# Patient Record
Sex: Female | Born: 1955 | Race: White | Hispanic: No
Health system: Southern US, Community
[De-identification: ages and names within clinical notes are randomized; demographics above are authoritative.]

## PROBLEM LIST (undated history)

## (undated) HISTORY — PX: BREAST CYST ASPIRATION: SHX578

## (undated) HISTORY — PX: ABDOMINAL HYSTERECTOMY: SHX81

---

## 2004-01-24 ENCOUNTER — Ambulatory Visit: Payer: Self-pay | Admitting: Podiatry

## 2004-07-15 ENCOUNTER — Ambulatory Visit: Payer: Self-pay | Admitting: Obstetrics and Gynecology

## 2004-11-05 ENCOUNTER — Ambulatory Visit: Payer: Self-pay | Admitting: Obstetrics and Gynecology

## 2006-02-25 ENCOUNTER — Ambulatory Visit: Payer: Self-pay | Admitting: Unknown Physician Specialty

## 2007-02-07 ENCOUNTER — Ambulatory Visit: Payer: Self-pay | Admitting: Specialist

## 2007-06-29 ENCOUNTER — Ambulatory Visit: Payer: Self-pay | Admitting: Unknown Physician Specialty

## 2007-07-04 ENCOUNTER — Ambulatory Visit: Payer: Self-pay | Admitting: Unknown Physician Specialty

## 2008-09-12 ENCOUNTER — Ambulatory Visit: Payer: Self-pay | Admitting: Unknown Physician Specialty

## 2009-12-29 ENCOUNTER — Ambulatory Visit: Payer: Self-pay | Admitting: Unknown Physician Specialty

## 2011-05-12 ENCOUNTER — Ambulatory Visit: Payer: Self-pay | Admitting: Unknown Physician Specialty

## 2012-08-03 ENCOUNTER — Ambulatory Visit: Payer: Self-pay | Admitting: Unknown Physician Specialty

## 2013-12-26 ENCOUNTER — Ambulatory Visit: Payer: Self-pay | Admitting: Internal Medicine

## 2015-02-26 DIAGNOSIS — C4492 Squamous cell carcinoma of skin, unspecified: Secondary | ICD-10-CM

## 2015-02-26 HISTORY — DX: Squamous cell carcinoma of skin, unspecified: C44.92

## 2015-04-16 ENCOUNTER — Other Ambulatory Visit: Payer: Self-pay | Admitting: Internal Medicine

## 2015-04-16 DIAGNOSIS — Z1239 Encounter for other screening for malignant neoplasm of breast: Secondary | ICD-10-CM

## 2015-04-24 ENCOUNTER — Ambulatory Visit
Admission: RE | Admit: 2015-04-24 | Discharge: 2015-04-24 | Disposition: A | Discharge status: Home / Self Care | Payer: BC Managed Care – PPO | Source: Ambulatory Visit | Attending: Internal Medicine | Admitting: Internal Medicine

## 2015-04-24 DIAGNOSIS — Z1239 Encounter for other screening for malignant neoplasm of breast: Secondary | ICD-10-CM

## 2015-04-24 DIAGNOSIS — Z1231 Encounter for screening mammogram for malignant neoplasm of breast: Secondary | ICD-10-CM | POA: Diagnosis present

## 2015-04-28 ENCOUNTER — Ambulatory Visit: Payer: Self-pay

## 2016-08-16 ENCOUNTER — Other Ambulatory Visit: Payer: Self-pay | Admitting: Internal Medicine

## 2016-08-16 DIAGNOSIS — Z1231 Encounter for screening mammogram for malignant neoplasm of breast: Secondary | ICD-10-CM

## 2016-09-02 ENCOUNTER — Ambulatory Visit
Admission: RE | Admit: 2016-09-02 | Discharge: 2016-09-02 | Disposition: A | Discharge status: Home / Self Care | Payer: BC Managed Care – PPO | Source: Ambulatory Visit | Attending: Internal Medicine | Admitting: Internal Medicine

## 2016-09-02 DIAGNOSIS — Z1231 Encounter for screening mammogram for malignant neoplasm of breast: Secondary | ICD-10-CM | POA: Insufficient documentation

## 2018-02-22 ENCOUNTER — Other Ambulatory Visit: Payer: Self-pay | Admitting: Internal Medicine

## 2018-02-22 DIAGNOSIS — Z1231 Encounter for screening mammogram for malignant neoplasm of breast: Secondary | ICD-10-CM

## 2018-03-20 ENCOUNTER — Ambulatory Visit
Admission: RE | Admit: 2018-03-20 | Discharge: 2018-03-20 | Disposition: A | Discharge status: Home / Self Care | Payer: BC Managed Care – PPO | Source: Ambulatory Visit | Attending: Internal Medicine | Admitting: Internal Medicine

## 2018-03-20 DIAGNOSIS — Z1231 Encounter for screening mammogram for malignant neoplasm of breast: Secondary | ICD-10-CM | POA: Diagnosis present

## 2019-05-24 ENCOUNTER — Other Ambulatory Visit: Payer: Self-pay | Admitting: Internal Medicine

## 2019-05-24 DIAGNOSIS — Z1231 Encounter for screening mammogram for malignant neoplasm of breast: Secondary | ICD-10-CM

## 2019-06-12 ENCOUNTER — Ambulatory Visit
Admission: RE | Admit: 2019-06-12 | Discharge: 2019-06-12 | Disposition: A | Discharge status: Home / Self Care | Payer: BC Managed Care – PPO | Source: Ambulatory Visit | Attending: Internal Medicine | Admitting: Internal Medicine

## 2019-06-12 DIAGNOSIS — Z1231 Encounter for screening mammogram for malignant neoplasm of breast: Secondary | ICD-10-CM | POA: Diagnosis present

## 2019-11-14 ENCOUNTER — Other Ambulatory Visit: Payer: Self-pay

## 2019-11-14 ENCOUNTER — Ambulatory Visit
Admission: RE | Admit: 2019-11-14 | Discharge: 2019-11-14 | Disposition: A | Discharge status: Home / Self Care | Payer: BC Managed Care – PPO | Source: Ambulatory Visit | Attending: General Practice | Admitting: General Practice

## 2019-11-14 ENCOUNTER — Other Ambulatory Visit: Payer: Self-pay | Admitting: General Practice

## 2019-11-14 ENCOUNTER — Ambulatory Visit
Admission: RE | Admit: 2019-11-14 | Discharge: 2019-11-14 | Disposition: A | Discharge status: Home / Self Care | Payer: BC Managed Care – PPO | Attending: General Practice | Admitting: General Practice

## 2019-11-14 DIAGNOSIS — R52 Pain, unspecified: Secondary | ICD-10-CM | POA: Diagnosis present

## 2020-04-15 ENCOUNTER — Other Ambulatory Visit: Payer: Self-pay

## 2020-04-15 ENCOUNTER — Ambulatory Visit: Payer: BC Managed Care – PPO | Admitting: Dermatology

## 2020-04-15 DIAGNOSIS — L309 Dermatitis, unspecified: Secondary | ICD-10-CM | POA: Diagnosis not present

## 2020-04-15 DIAGNOSIS — L509 Urticaria, unspecified: Secondary | ICD-10-CM | POA: Diagnosis not present

## 2020-04-15 MED ORDER — HYDROXYZINE HCL 10 MG PO TABS: ORAL_TABLET | ORAL | 1 refills | Status: AC

## 2020-04-15 MED ORDER — CLOBETASOL PROPIONATE 0.05 % EX SOLN: 1.0000 "application " | Freq: Two times a day (BID) | CUTANEOUS | 0 refills | Status: AC

## 2020-04-15 MED ORDER — LEVOCETIRIZINE DIHYDROCHLORIDE 5 MG PO TABS: 5.0000 mg | ORAL_TABLET | Freq: Every morning | ORAL | 3 refills | Status: AC

## 2020-04-15 NOTE — Patient Instructions (Addendum)
Mix 50 ml of Clobetasol solution (prescription) into a 1 pound jar of Cerave cream. Mark jar as mixed with steroid. Apply to affected areas twice daily. Avoid face, groin and underarms.  Recommend changing from Claritin to Xyzal 5 mg 1 po qd  Start Hydroxyzine 1-3 po qhs (may make drowsy)  May add back Claritin mid day if needed for break through itching.

## 2020-04-15 NOTE — Progress Notes (Signed)
   Follow-Up Visit   Subjective  Destiny Holland is a 65 y.o. female who presents for the following: Rash (Hives that are spreading and are very itchy. Started last night and she took Benadryl which helped the itch but she woke up with a headache. She takes Claritin daily. Has used [employer] and Cetaphil lotion for years. No new soaps or detergents. No new medications. She does have increased stress in taking care of her 40 year oil mother and stress at work.). Skin tends to get dry in winter.  No one else itching at home. No prior h/o eczema.    The following portions of the chart were reviewed this encounter and updated as appropriate:       Review of Systems:  No other skin or systemic complaints except as noted in HPI or Assessment and Plan.  Objective  Well appearing patient in no apparent distress; mood and affect are within normal limits.  A focused examination was performed including trunk, arms. Relevant physical exam findings are noted in the Assessment and Plan.  Objective  Left Elbow - Posterior: Smooth edematous pink plaques of elbows, lower back  Objective  Mid Back: Numerous pink scaly papules and patches of trunk and arms   Assessment & Plan  Urticaria Left Elbow - Posterior  Recommend changing from Claritin to Xyzal 5 mg 1 po qd  Start Hydroxyzine 10 mg 1-3 po qhs (may make drowsy)  May add back Claritin mid day if needed for breakthrough itching.  hydrOXYzine (ATARAX/VISTARIL) 10 MG tablet - Left Elbow - Posterior  levocetirizine (XYZAL) 5 MG tablet - Left Elbow - Posterior  Dermatitis Mid Back  Nummular/asteatotic  Mix 50 ml of Clobetasol solution (prescription) into a 1 pound jar of Cerave cream. Mark jar as mixed with steroid. Apply to affected areas twice daily up to 4 weeks. Avoid face, groin and underarms.  Continue mild soap  Consider bx and/or prednisone if not improving on f/up  clobetasol (TEMOVATE) 0.05 % external solution - Mid  Back  Return for Follow up as scheduled w/Dr. Raliegh Ip in one week.  I, Ashok Cordia, CMA, am acting as scribe for Brendolyn Patty, MD .  Documentation: I have reviewed the above documentation for accuracy and completeness, and I agree with the above.  Brendolyn Patty MD

## 2020-04-21 ENCOUNTER — Ambulatory Visit: Payer: BC Managed Care – PPO | Admitting: Dermatology

## 2020-04-21 ENCOUNTER — Other Ambulatory Visit: Payer: Self-pay | Admitting: Dermatology

## 2020-04-21 ENCOUNTER — Other Ambulatory Visit: Payer: Self-pay

## 2020-04-21 DIAGNOSIS — Z1283 Encounter for screening for malignant neoplasm of skin: Secondary | ICD-10-CM

## 2020-04-21 DIAGNOSIS — L814 Other melanin hyperpigmentation: Secondary | ICD-10-CM

## 2020-04-21 DIAGNOSIS — L309 Dermatitis, unspecified: Secondary | ICD-10-CM | POA: Diagnosis not present

## 2020-04-21 DIAGNOSIS — D485 Neoplasm of uncertain behavior of skin: Secondary | ICD-10-CM

## 2020-04-21 DIAGNOSIS — L820 Inflamed seborrheic keratosis: Secondary | ICD-10-CM

## 2020-04-21 DIAGNOSIS — D1800 Hemangioma unspecified site: Secondary | ICD-10-CM

## 2020-04-21 DIAGNOSIS — L578 Other skin changes due to chronic exposure to nonionizing radiation: Secondary | ICD-10-CM | POA: Diagnosis not present

## 2020-04-21 DIAGNOSIS — Z85828 Personal history of other malignant neoplasm of skin: Secondary | ICD-10-CM | POA: Diagnosis not present

## 2020-04-21 DIAGNOSIS — L821 Other seborrheic keratosis: Secondary | ICD-10-CM

## 2020-04-21 DIAGNOSIS — D229 Melanocytic nevi, unspecified: Secondary | ICD-10-CM

## 2020-04-21 NOTE — Patient Instructions (Signed)

## 2020-04-21 NOTE — Progress Notes (Unsigned)
Follow-Up Visit   Subjective  Destiny Holland is a 65 y.o. female who presents for the following: Annual Exam. The patient presents for Total-Body Skin Exam (TBSE) for skin cancer screening and mole check.  The following portions of the chart were reviewed this encounter and updated as appropriate:   Allergies  Meds  Problems  Med Hx  Surg Hx  Fam Hx     Review of Systems:  No other skin or systemic complaints except as noted in HPI or Assessment and Plan.  Objective  Well appearing patient in no apparent distress; mood and affect are within normal limits.  A full examination was performed including scalp, head, eyes, ears, nose, lips, neck, chest, axillae, abdomen, back, buttocks, bilateral upper extremities, bilateral lower extremities, hands, feet, fingers, toes, fingernails, and toenails. All findings within normal limits unless otherwise noted below.  Objective  Right Elbow - Posterior: 0.6 cm hyperkeratotic papule  Objective  Right chest: Red patch with dilated blood vessels       Objective  Left mandible x 1, right thigh x 1 (2): Erythematous keratotic or waxy stuck-on papule or plaque.   Objective  Back: Erythema and crusts   Assessment & Plan    History of Squamous Cell Carcinoma of the Skin - No evidence of recurrence today - No lymphadenopathy - Recommend regular full body skin exams - Recommend daily broad spectrum sunscreen SPF 30+ to sun-exposed areas, reapply every 2 hours as needed.  - Call if any new or changing lesions are noted between office visits   Lentigines - Scattered tan macules - Discussed due to sun exposure - Benign, observe - Call for any changes  Seborrheic Keratoses - Stuck-on, waxy, tan-brown papules and plaques  - Discussed benign etiology and prognosis. - Observe - Call for any changes  Melanocytic Nevi - Tan-brown and/or pink-flesh-colored symmetric macules and papules - Benign appearing on exam today -  Observation - Call clinic for new or changing moles - Recommend daily use of broad spectrum spf 30+ sunscreen to sun-exposed areas.   Hemangiomas - Red papules - Discussed benign nature - Observe - Call for any changes  Actinic Damage - Chronic, secondary to cumulative UV/sun exposure - diffuse scaly erythematous macules with underlying dyspigmentation - Recommend daily broad spectrum sunscreen SPF 30+ to sun-exposed areas, reapply every 2 hours as needed.  - Call for new or changing lesions.  Skin cancer screening performed today.   Neoplasm of uncertain behavior of skin (2) Right Elbow - Posterior  Epidermal / dermal shaving  Lesion diameter (cm):  0.6 Informed consent: discussed and consent obtained   Timeout: patient name, date of birth, surgical site, and procedure verified   Procedure prep:  Patient was prepped and draped in usual sterile fashion Prep type:  Isopropyl alcohol Anesthesia: the lesion was anesthetized in a standard fashion   Anesthetic:  1% lidocaine w/ epinephrine 1-100,000 buffered w/ 8.4% NaHCO3 Instrument used: flexible razor blade   Hemostasis achieved with: pressure, aluminum chloride and electrodesiccation   Outcome: patient tolerated procedure well   Post-procedure details: sterile dressing applied and wound care instructions given   Dressing type: bandage and petrolatum    Specimen 1 - Surgical pathology Differential Diagnosis: Inflamed SK vs Irritated nevus R/O Ca Check Margins: No 0.6 cm hyperkeratotic papule  Right chest  Skin / nail biopsy Type of biopsy: tangential   Informed consent: discussed and consent obtained   Timeout: patient name, date of birth, surgical site, and procedure  verified   Procedure prep:  Patient was prepped and draped in usual sterile fashion Prep type:  Isopropyl alcohol Anesthesia: the lesion was anesthetized in a standard fashion   Anesthetic:  1% lidocaine w/ epinephrine 1-100,000 buffered w/ 8.4%  NaHCO3 Instrument used: flexible razor blade   Hemostasis achieved with: pressure, aluminum chloride and electrodesiccation   Outcome: patient tolerated procedure well   Post-procedure details: sterile dressing applied and wound care instructions given   Dressing type: bandage and petrolatum    Specimen 2 - Surgical pathology Differential Diagnosis: R/O BCC Check Margins: No Red patch with dilated blood vessels  Inflamed seborrheic keratosis (2) Left mandible x 1, right thigh x 1  Destruction of lesion - Left mandible x 1, right thigh x 1 Complexity: simple   Destruction method: cryotherapy   Informed consent: discussed and consent obtained   Timeout:  patient name, date of birth, surgical site, and procedure verified Lesion destroyed using liquid nitrogen: Yes   Region frozen until ice ball extended beyond lesion: Yes   Outcome: patient tolerated procedure well with no complications   Post-procedure details: wound care instructions given    Dermatitis Back Nummular Dermatitis/Asteototic eczema/ Atopic Dermatitis - Improving  Atopic dermatitis (eczema) is a chronic, relapsing, pruritic condition that can significantly affect quality of life. It is often associated with allergic rhinitis and/or asthma and can require treatment with topical medications, phototherapy, or in severe cases a biologic medication called Dupixent in older children and adults.   Continue Clobetasol/Cerave cream mix to affected areas until clear  Other Related Medications clobetasol (TEMOVATE) 0.05 % external solution  Skin cancer screening  Return in about 1 year (around 04/21/2021) for TBSE.  I, Ashok Cordia, CMA, am acting as scribe for Sarina Ser, MD .  Documentation: I have reviewed the above documentation for accuracy and completeness, and I agree with the above.  Sarina Ser, MD

## 2020-04-24 ENCOUNTER — Encounter: Payer: Self-pay | Admitting: Dermatology

## 2020-04-29 ENCOUNTER — Telehealth: Payer: Self-pay

## 2020-04-29 NOTE — Telephone Encounter (Signed)
Patient informed of pathology results and appointment scheduled for treatment.

## 2020-04-29 NOTE — Telephone Encounter (Signed)
-----   Message from Ralene Bathe, MD sent at 04/23/2020 12:49 PM EST ----- Diagnosis 1. Skin , right elbow-posterior VERRUCA VULGARIS, IRRITATED 2. Skin , right chest LICHEN PLANUS-LIKE KERATOSIS WITH FEATURES OF ACTINIC KERATOSIS  1- benign viral wart May recur Will treat if recurs 2- PreCancer Schedule in 2 mos for re-check and plan to treat (with LN2)

## 2020-06-24 ENCOUNTER — Other Ambulatory Visit: Payer: Self-pay

## 2020-06-24 ENCOUNTER — Ambulatory Visit: Payer: BC Managed Care – PPO | Admitting: Dermatology

## 2020-06-24 ENCOUNTER — Encounter: Payer: Self-pay | Admitting: Dermatology

## 2020-06-24 DIAGNOSIS — L910 Hypertrophic scar: Secondary | ICD-10-CM

## 2020-06-24 DIAGNOSIS — L570 Actinic keratosis: Secondary | ICD-10-CM | POA: Diagnosis not present

## 2020-06-24 DIAGNOSIS — L578 Other skin changes due to chronic exposure to nonionizing radiation: Secondary | ICD-10-CM | POA: Diagnosis not present

## 2020-06-24 DIAGNOSIS — B078 Other viral warts: Secondary | ICD-10-CM

## 2020-06-24 NOTE — Patient Instructions (Addendum)

## 2020-06-24 NOTE — Progress Notes (Signed)
   Follow-Up Visit   Subjective  Destiny Holland is a 65 y.o. female who presents for the following: Follow-up (Patient here today for a 2 month follow up to treat bx proven AK at right chest. ).  Patient also had a bx at right elbow that showed verruca.   The following portions of the chart were reviewed this encounter and updated as appropriate:   Allergies  Meds  Problems  Med Hx  Surg Hx  Fam Hx      Review of Systems:  No other skin or systemic complaints except as noted in HPI or Assessment and Plan.  Objective  Well appearing patient in no apparent distress; mood and affect are within normal limits.  A focused examination was performed including chest, right elbow. Relevant physical exam findings are noted in the Assessment and Plan.  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 Start Serica gel in meantime.  Other viral warts Right Elbow Bx proven wart Clear today. May recur - observe.   Actinic Damage - chronic, secondary to cumulative UV radiation exposure/sun exposure over time - diffuse scaly erythematous macules with underlying dyspigmentation - Recommend daily broad spectrum sunscreen SPF 30+ to sun-exposed areas, reapply every 2 hours as needed.  -  Recommend staying in the shade or wearing long sleeves, sun glasses (UVA+UVB protection) and wide brim hats (4-inch brim around the entire circumference of the hat). - Call for new or changing lesions.  Return in about 3 months (around 09/23/2020) for recheck AK treated with LN2 at right chest.  Graciella Belton, RMA, am acting as scribe for Sarina Ser, MD . Documentation: I have reviewed the above documentation for accuracy and completeness, and I agree with the above.  Sarina Ser, MD

## 2020-07-01 ENCOUNTER — Other Ambulatory Visit: Payer: Self-pay | Admitting: Internal Medicine

## 2020-07-01 DIAGNOSIS — Z1231 Encounter for screening mammogram for malignant neoplasm of breast: Secondary | ICD-10-CM

## 2020-07-15 ENCOUNTER — Ambulatory Visit
Admission: RE | Admit: 2020-07-15 | Discharge: 2020-07-15 | Disposition: A | Discharge status: Home / Self Care | Payer: BC Managed Care – PPO | Source: Ambulatory Visit | Attending: Internal Medicine | Admitting: Internal Medicine

## 2020-07-15 ENCOUNTER — Other Ambulatory Visit: Payer: Self-pay

## 2020-07-15 DIAGNOSIS — Z1231 Encounter for screening mammogram for malignant neoplasm of breast: Secondary | ICD-10-CM | POA: Diagnosis present

## 2020-10-06 ENCOUNTER — Other Ambulatory Visit: Payer: Self-pay

## 2020-10-06 ENCOUNTER — Ambulatory Visit: Payer: BC Managed Care – PPO | Admitting: Dermatology

## 2020-10-06 DIAGNOSIS — L820 Inflamed seborrheic keratosis: Secondary | ICD-10-CM

## 2020-10-06 DIAGNOSIS — Z872 Personal history of diseases of the skin and subcutaneous tissue: Secondary | ICD-10-CM

## 2020-10-06 DIAGNOSIS — L578 Other skin changes due to chronic exposure to nonionizing radiation: Secondary | ICD-10-CM | POA: Diagnosis not present

## 2020-10-06 DIAGNOSIS — L821 Other seborrheic keratosis: Secondary | ICD-10-CM

## 2020-10-06 DIAGNOSIS — D485 Neoplasm of uncertain behavior of skin: Secondary | ICD-10-CM | POA: Diagnosis not present

## 2020-10-06 DIAGNOSIS — D492 Neoplasm of unspecified behavior of bone, soft tissue, and skin: Secondary | ICD-10-CM

## 2020-10-06 NOTE — Progress Notes (Signed)
   Follow-Up Visit   Subjective  Destiny Holland is a 65 y.o. female who presents for the following: Actinic Keratosis (3 months f/u hx of Aks at the right chest treated with LN2). Pt c/o several irritated spots on the left arm growth pt squeezed and left lower leg pink irritated area pt would like checked today.   The following portions of the chart were reviewed this encounter and updated as appropriate:   Allergies  Meds  Problems  Med Hx  Surg Hx  Fam Hx     Review of Systems:  No other skin or systemic complaints except as noted in HPI or Assessment and Plan.  Objective  Well appearing patient in no apparent distress; mood and affect are within normal limits.  A focused examination was performed including face,chest. Relevant physical exam findings are noted in the Assessment and Plan.  left forearm near wrist 0.4 cm pink inflamed papule   Left Lower Leg - Anterior Erythematous keratotic or waxy stuck-on papule or plaque.   right chest Scar healing    Assessment & Plan  Neoplasm of skin left forearm near wrist Red papule patient squeezed and exacerbated. Suspect trauma vs bite reaction. If not gone in 4-6 weeks return for biopsy  Observe   Inflamed seborrheic keratosis Left Lower Leg - Anterior  Destruction of lesion - Left Lower Leg - Anterior Complexity: simple   Destruction method: cryotherapy   Informed consent: discussed and consent obtained   Timeout:  patient name, date of birth, surgical site, and procedure verified Lesion destroyed using liquid nitrogen: Yes   Region frozen until ice ball extended beyond lesion: Yes   Outcome: patient tolerated procedure well with no complications   Post-procedure details: wound care instructions given    Hx of actinic keratosis right chest  Clear. Observe for recurrence. Call clinic for new or changing lesions.  Recommend regular skin exams, daily broad-spectrum spf 30+ sunscreen use, and photoprotection.      Actinic Damage - chronic, secondary to cumulative UV radiation exposure/sun exposure over time - diffuse scaly erythematous macules with underlying dyspigmentation - Recommend daily broad spectrum sunscreen SPF 30+ to sun-exposed areas, reapply every 2 hours as needed.  - Recommend staying in the shade or wearing long sleeves, sun glasses (UVA+UVB protection) and wide brim hats (4-inch brim around the entire circumference of the hat). - Call for new or changing lesions.   Seborrheic Keratoses - Stuck-on, waxy, tan-brown papules and/or plaques  - Benign-appearing - Discussed benign etiology and prognosis. - Observe - Call for any changes  Return for as scheduled July 01, 2021 for TBSE .  IMarye Round, CMA, am acting as scribe for Sarina Ser, MD .  Documentation: I have reviewed the above documentation for accuracy and completeness, and I agree with the above.  Sarina Ser, MD

## 2020-10-06 NOTE — Patient Instructions (Addendum)

## 2020-10-07 ENCOUNTER — Encounter: Payer: Self-pay | Admitting: Dermatology

## 2020-12-11 ENCOUNTER — Ambulatory Visit: Payer: BC Managed Care – PPO | Admitting: Dermatology

## 2020-12-11 ENCOUNTER — Other Ambulatory Visit: Payer: Self-pay

## 2020-12-11 DIAGNOSIS — C44729 Squamous cell carcinoma of skin of left lower limb, including hip: Secondary | ICD-10-CM | POA: Diagnosis not present

## 2020-12-11 DIAGNOSIS — L578 Other skin changes due to chronic exposure to nonionizing radiation: Secondary | ICD-10-CM

## 2020-12-11 DIAGNOSIS — C4492 Squamous cell carcinoma of skin, unspecified: Secondary | ICD-10-CM

## 2020-12-11 DIAGNOSIS — D485 Neoplasm of uncertain behavior of skin: Secondary | ICD-10-CM

## 2020-12-11 HISTORY — DX: Squamous cell carcinoma of skin, unspecified: C44.92

## 2020-12-11 MED ORDER — MUPIROCIN 2 % EX OINT: TOPICAL_OINTMENT | CUTANEOUS | 0 refills | Status: DC

## 2020-12-11 NOTE — Patient Instructions (Signed)

## 2020-12-11 NOTE — Progress Notes (Deleted)
   Follow-Up Visit   Subjective  Destiny Holland is a 65 y.o. female who presents for the following: No chief complaint on file..  ***  The following portions of the chart were reviewed this encounter and updated as appropriate:      {Review of Systems:34166::"No other skin or systemic complaints."}  Objective  Well appearing patient in no apparent distress; mood and affect are within normal limits.  {BWIO:03559::"R full examination was performed including scalp, head, eyes, ears, nose, lips, neck, chest, axillae, abdomen, back, buttocks, bilateral upper extremities, bilateral lower extremities, hands, feet, fingers, toes, fingernails, and toenails. All findings within normal limits unless otherwise noted below."}   Assessment & Plan   No follow-ups on file. IRuthell Rummage, CMA, am acting as scribe for Sarina Ser, MD.

## 2020-12-12 ENCOUNTER — Encounter: Payer: Self-pay | Admitting: Dermatology

## 2020-12-12 NOTE — Progress Notes (Signed)
   Follow-Up Visit   Subjective  Destiny Holland is a 65 y.o. female who presents for the following: Follow-up (Patient here today for follow up on area on left lower leg that was treated with ln2 several months ago. Patient reports that area has not healed and seems to be getting worse. ).  The following portions of the chart were reviewed this encounter and updated as appropriate:   Allergies  Meds  Problems  Med Hx  Surg Hx  Fam Hx     Review of Systems:  No other skin or systemic complaints except as noted in HPI or Assessment and Plan.  Objective  Well appearing patient in no apparent distress; mood and affect are within normal limits.  examination was performed including scalp, head, eyes, ears, nose, lips, neck,  lower extremities,All findings within normal limits unless otherwise noted below.  L med pretibial 1.5 cm pink to violaceous very tender patch.   Assessment & Plan  Neoplasm of uncertain behavior of skin L med pretibial  Skin / nail biopsy Type of biopsy: tangential   Informed consent: discussed and consent obtained   Timeout: patient name, date of birth, surgical site, and procedure verified   Procedure prep:  Patient was prepped and draped in usual sterile fashion Prep type:  Isopropyl alcohol Anesthesia: the lesion was anesthetized in a standard fashion   Anesthetic:  1% lidocaine w/ epinephrine 1-100,000 buffered w/ 8.4% NaHCO3 Instrument used: flexible razor blade   Hemostasis achieved with: pressure, aluminum chloride and electrodesiccation   Outcome: patient tolerated procedure well   Post-procedure details: sterile dressing applied and wound care instructions given   Dressing type: bandage and petrolatum    mupirocin ointment (BACTROBAN) 2 % Apply to biopsy site QD until healed.  Specimen 1 - Surgical pathology Differential Diagnosis: D48.5 r/o skin CA vs lipodermatosclerosus  Check Margins: No  Start Mupirocin 2% ointment to aa's QD until  healed.   Actinic Damage - chronic, secondary to cumulative UV radiation exposure/sun exposure over time - diffuse scaly erythematous macules with underlying dyspigmentation - Recommend daily broad spectrum sunscreen SPF 30+ to sun-exposed areas, reapply every 2 hours as needed.  - Recommend staying in the shade or wearing long sleeves, sun glasses (UVA+UVB protection) and wide brim hats (4-inch brim around the entire circumference of the hat). - Call for new or changing lesions.  Return for appointment as scheduled.  Luther Redo, CMA, am acting as scribe for Sarina Ser, MD . Documentation: I have reviewed the above documentation for accuracy and completeness, and I agree with the above.  Sarina Ser, MD

## 2020-12-16 ENCOUNTER — Telehealth: Payer: Self-pay

## 2020-12-16 NOTE — Telephone Encounter (Signed)
Unable to leave a voicemail as no mailbox set up.

## 2020-12-16 NOTE — Telephone Encounter (Signed)
-----   Message from Ralene Bathe, MD sent at 12/15/2020  1:01 PM EDT ----- Diagnosis Skin , left med pretibial WELL DIFFERENTIATED SQUAMOUS CELL CARCINOMA WITH SUPERFICIAL INFILTRATION, ULCERATED  Cancer - SCC Schedule for surgery

## 2020-12-16 NOTE — Telephone Encounter (Signed)
Patient advised of BX results and scheduled for surgery.

## 2020-12-24 ENCOUNTER — Other Ambulatory Visit: Payer: Self-pay

## 2020-12-24 ENCOUNTER — Encounter: Payer: Self-pay | Admitting: Dermatology

## 2020-12-24 ENCOUNTER — Ambulatory Visit: Payer: BC Managed Care – PPO | Admitting: Dermatology

## 2020-12-24 DIAGNOSIS — C4492 Squamous cell carcinoma of skin, unspecified: Secondary | ICD-10-CM

## 2020-12-24 DIAGNOSIS — C44729 Squamous cell carcinoma of skin of left lower limb, including hip: Secondary | ICD-10-CM

## 2020-12-24 MED ORDER — DOXYCYCLINE HYCLATE 100 MG PO TABS: ORAL_TABLET | ORAL | 0 refills | Status: DC

## 2020-12-24 NOTE — Progress Notes (Signed)
   Follow-Up Visit   Subjective  Destiny Holland is a 65 y.o. female who presents for the following: recheck bx site (Of the L med pretibial - bx proven SCC, patient is concerned that site may be infected and would like it checked today.).  The following portions of the chart were reviewed this encounter and updated as appropriate:   Allergies  Meds  Problems  Med Hx  Surg Hx  Fam Hx     Review of Systems:  No other skin or systemic complaints except as noted in HPI or Assessment and Plan.  Objective  Well appearing patient in no apparent distress; mood and affect are within normal limits.  A focused examination was performed including L lower leg. Relevant physical exam findings are noted in the Assessment and Plan.  L med pretibial Healing ulceration.   Assessment & Plan  Squamous cell carcinoma of skin -biopsy-proven.  Inflamed biopsy site improved today.  No evidence of infection, but recent pain and redness at site now improved. L med pretibial  doxycycline (VIBRA-TABS) 100 MG tablet Take one tab po BID x 1 week with food.  Doesn't appear to infected at this time, but will send in in case signs of infection appear.   Start Doxycycline 100mg  po BID x 1 week. Doxycycline should be taken with food to prevent nausea. Do not lay down for 30 minutes after taking. Be cautious with sun exposure and use good sun protection while on this medication. Pregnant women should not take this medication.   Continue would care daily.  Return for appointment as scheduled.  Luther Redo, CMA, am acting as scribe for Sarina Ser, MD . Documentation: I have reviewed the above documentation for accuracy and completeness, and I agree with the above.  Sarina Ser, MD

## 2020-12-24 NOTE — Patient Instructions (Signed)

## 2020-12-25 ENCOUNTER — Ambulatory Visit: Payer: BC Managed Care – PPO | Admitting: Dermatology

## 2021-01-20 ENCOUNTER — Ambulatory Visit: Payer: BC Managed Care – PPO | Admitting: Dermatology

## 2021-01-20 ENCOUNTER — Encounter: Payer: Self-pay | Admitting: Dermatology

## 2021-01-20 ENCOUNTER — Other Ambulatory Visit: Payer: Self-pay

## 2021-01-20 DIAGNOSIS — C44729 Squamous cell carcinoma of skin of left lower limb, including hip: Secondary | ICD-10-CM | POA: Diagnosis not present

## 2021-01-20 DIAGNOSIS — C4492 Squamous cell carcinoma of skin, unspecified: Secondary | ICD-10-CM

## 2021-01-20 NOTE — Patient Instructions (Addendum)
Wound Care Instructions  Cleanse wound gently with antibacterial soap and water once a day then pat dry with clean gauze. Apply a thing coat of Petrolatum (petroleum jelly, "Vaseline") over the wound (unless you have an allergy to this). We recommend that you use a new, sterile tube of Vaseline. Do not pick or remove scabs. Do not remove the yellow or white "healing tissue" from the base of the wound.  Cover the wound with fresh, clean, nonstick gauze and secure with paper tape. You may use Band-Aids in place of gauze and tape if the would is small enough, but would recommend trimming much of the tape off as there is often too much. Sometimes Band-Aids can irritate the skin.  You should call the office for your biopsy report after 1 week if you have not already been contacted.  If you experience any problems, such as abnormal amounts of bleeding, swelling, significant bruising, significant pain, or evidence of infection, please call the office immediately.  FOR ADULT SURGERY PATIENTS: If you need something for pain relief you may take 1 extra strength Tylenol (acetaminophen) AND 2 Ibuprofen (200mg  each) together every 4 hours as needed for pain. (do not take these if you are allergic to them or if you have a reason you should not take them.) Typically, you may only need pain medication for 1 to 3 days.     If you have any questions or concerns for your doctor, please call our main line at [phone] and press option 4 to reach your doctor's medical assistant. If no one answers, please leave a voicemail as directed and we will return your call as soon as possible. Messages left after 4 pm will be answered the following business day.   You may also send [place] a message via [place]. We typically respond to MyChart messages within 1-2 business days.  For prescription refills, please ask your pharmacy to contact our office. Our fax number is [phone].  If you have an urgent issue when the clinic is  closed that cannot wait until the next business day, you can page your doctor at the number below.    Please note that while we do our best to be available for urgent issues outside of office hours, we are not available 24/7.   If you have an urgent issue and are unable to reach [place], you may choose to seek medical care at your doctor's office, retail clinic, urgent care center, or emergency room.  If you have a medical emergency, please immediately call 911 or go to the emergency department.  Pager Numbers  - Dr. Nehemiah Massed: [phone]  - Dr. Laurence Ferrari: [phone]  - Dr. Nicole Kindred: [phone]  In the event of inclement weather, please call our main line at [phone] for an update on the status of any delays or closures.  Dermatology Medication Tips: Please keep the boxes that topical medications come in in order to help keep track of the instructions about where and how to use these. Pharmacies typically print the medication instructions only on the boxes and not directly on the medication tubes.   If your medication is too expensive, please contact our office at [phone] option 4 or send [place] a message through [place].   We are unable to tell what your co-pay for medications will be in advance as this is different depending on your insurance coverage. However, we may be able to find a substitute medication at lower cost or fill out paperwork to get insurance to  cover a needed medication.   If a prior authorization is required to get your medication covered by your insurance company, please allow [place] 1-2 business days to complete this process.  Drug prices often vary depending on where the prescription is filled and some pharmacies may offer cheaper prices.  The website www.goodrx.com contains coupons for medications through different pharmacies. The prices here do not account for what the cost may be with help from insurance (it may be cheaper with your insurance), but the website can  give you the price if you did not use any insurance.  - You can print the associated coupon and take it with your prescription to the pharmacy.  - You may also stop by our office during regular business hours and pick up a GoodRx coupon card.  - If you need your prescription sent electronically to a different pharmacy, notify our office through [facility] or by phone at [phone] option 4.

## 2021-01-20 NOTE — Progress Notes (Signed)
   Follow-Up Visit   Subjective  Destiny Holland is a 65 y.o. female who presents for the following: SCC bx proven (L medial pretibial, pt presents for excision).  The following portions of the chart were reviewed this encounter and updated as appropriate:   Allergies  Meds  Problems  Med Hx  Surg Hx  Fam Hx     Review of Systems:  No other skin or systemic complaints except as noted in HPI or Assessment and Plan.  Objective  Well appearing patient in no apparent distress; mood and affect are within normal limits.  A focused examination was performed including Left pretibial. Relevant physical exam findings are noted in the Assessment and Plan.  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  Skin excision  Lesion length (cm):  2.2 Lesion width (cm):  2.2 Margin per side (cm):  0.2 Total excision diameter (cm):  2.6 Informed consent: discussed and consent obtained   Timeout: patient name, date of birth, surgical site, and procedure verified   Procedure prep:  Patient was prepped and draped in usual sterile fashion Prep type:  Isopropyl alcohol and povidone-iodine Anesthesia: the lesion was anesthetized in a standard fashion   Anesthetic:  1% lidocaine w/ epinephrine 1-100,000 buffered w/ 8.4% NaHCO3 (4cc lido w/ epi, 3cc bupivicaine, Total = 7cc) Instrument used: #15 blade   Hemostasis achieved with: suture, pressure and Gelfoam   Hemostasis achieved with comment:  Electrocautery Outcome: patient tolerated procedure well with no complications   Post-procedure details: sterile dressing applied and wound care instructions given   Dressing type: pressure dressing and petrolatum   Additional details:  2ndary intention healing Specimen marked with ink superior 12:00 edge  Specimen 1 - Surgical pathology Differential Diagnosis: Bx proven SCC  Check Margins: No Pink bx site 2.2cm [id number] Marked  with ink superior 12:00 edge  Bx proven, excised today  Start polysporin or vaseline qd to excision site  Return in about 1 day (around 01/21/2021) for dressing change.  I, Othelia Pulling, RMA, am acting as scribe for Sarina Ser, MD . Documentation: I have reviewed the above documentation for accuracy and completeness, and I agree with the above.  Sarina Ser, MD

## 2021-01-21 ENCOUNTER — Ambulatory Visit (INDEPENDENT_AMBULATORY_CARE_PROVIDER_SITE_OTHER): Payer: BC Managed Care – PPO | Admitting: Dermatology

## 2021-01-21 ENCOUNTER — Telehealth: Payer: Self-pay

## 2021-01-21 DIAGNOSIS — Z4800 Encounter for change or removal of nonsurgical wound dressing: Secondary | ICD-10-CM

## 2021-01-21 NOTE — Telephone Encounter (Signed)
Pt doing fine after yesterday's surgery.  She did have some pain, but the tylenol does help.  Pt has an appointment today for dressing change./sh

## 2021-01-21 NOTE — Progress Notes (Signed)
   Follow-Up Visit   Subjective  Destiny Holland is a 65 y.o. female who presents for the following: Wound Check (1 day follow up left medial pretibial, SCC excised 01/20/21).  The following portions of the chart were reviewed this encounter and updated as appropriate:   Allergies  Meds  Problems  Med Hx  Surg Hx  Fam Hx      Review of Systems:  No other skin or systemic complaints except as noted in HPI or Assessment and Plan.  Objective  Well appearing patient in no apparent distress; mood and affect are within normal limits.  A focused examination was performed including left leg . Relevant physical exam findings are noted in the Assessment and Plan.   Assessment & Plan  Encounter for change or removal of surgical wound dressing left medial pretibial  Follow-up as scheduled  I, Marye Round, CMA, am acting as scribe for Sarina Ser, MD .  Documentation: I have reviewed the above documentation for accuracy and completeness, and I agree with the above.  Sarina Ser, MD

## 2021-01-21 NOTE — Patient Instructions (Signed)

## 2021-01-27 ENCOUNTER — Other Ambulatory Visit: Payer: Self-pay

## 2021-01-27 ENCOUNTER — Ambulatory Visit (INDEPENDENT_AMBULATORY_CARE_PROVIDER_SITE_OTHER): Payer: BC Managed Care – PPO | Admitting: Dermatology

## 2021-01-27 DIAGNOSIS — Z4800 Encounter for change or removal of nonsurgical wound dressing: Secondary | ICD-10-CM

## 2021-01-27 NOTE — Progress Notes (Signed)
   Follow-Up Visit   Subjective  Destiny Holland is a 65 y.o. female who presents for the following: Follow-up (1 week f/u wound on the left pretibial, biopsy proven SCC treated with excision removal 01/20/21).  The following portions of the chart were reviewed this encounter and updated as appropriate:   Allergies  Meds  Problems  Med Hx  Surg Hx  Fam Hx      Review of Systems:  No other skin or systemic complaints except as noted in HPI or Assessment and Plan.  Objective  Well appearing patient in no apparent distress; mood and affect are within normal limits.  A focused examination was performed including left pretibial. Relevant physical exam findings are noted in the Assessment and Plan.  left pretibial Pink bx site 2.2cm   Assessment & Plan  Encounter for change or removal of surgical wound dressing left pretibial  Wound healing, no sign of infection  - Biopsy proven SCC treated with excision -margins free  Cleansed skin with puracyn, applied non stick gauge and plain vaseline   Return in about 3 weeks (around 02/17/2021) for wound check .  IMarye Round, CMA, am acting as scribe for Sarina Ser, MD .  Documentation: I have reviewed the above documentation for accuracy and completeness, and I agree with the above.  Sarina Ser, MD

## 2021-01-27 NOTE — Patient Instructions (Signed)

## 2021-01-31 ENCOUNTER — Encounter: Payer: Self-pay | Admitting: Dermatology

## 2021-02-01 ENCOUNTER — Encounter: Payer: Self-pay | Admitting: Dermatology

## 2021-02-10 ENCOUNTER — Ambulatory Visit: Payer: BC Managed Care – PPO | Admitting: Dermatology

## 2021-02-18 ENCOUNTER — Ambulatory Visit: Payer: BC Managed Care – PPO | Admitting: Dermatology

## 2021-02-18 ENCOUNTER — Other Ambulatory Visit: Payer: Self-pay

## 2021-02-18 DIAGNOSIS — S81802D Unspecified open wound, left lower leg, subsequent encounter: Secondary | ICD-10-CM

## 2021-02-18 DIAGNOSIS — T1490XD Injury, unspecified, subsequent encounter: Secondary | ICD-10-CM

## 2021-02-18 NOTE — Patient Instructions (Signed)

## 2021-02-18 NOTE — Progress Notes (Signed)
   Follow-Up Visit   Subjective  Destiny Holland is a 65 y.o. female who presents for the following: Recheck wound (Bx proven SCC of the L med pretibial 01/20/21 - patient is here today for wound recheck. ).  The following portions of the chart were reviewed this encounter and updated as appropriate:   Allergies  Meds  Problems  Med Hx  Surg Hx  Fam Hx     Review of Systems:  No other skin or systemic complaints except as noted in HPI or Assessment and Plan.  Objective  Well appearing patient in no apparent distress; mood and affect are within normal limits.  A focused examination was performed including the L lower leg. Relevant physical exam findings are noted in the Assessment and Plan.  L medial pretibial 2.8 x 2.2 healing ulceration   Assessment & Plan  Healing wound L medial pretibial  S/P excision 01/821 for bx proven SCC -   Continue wound care daily. Recheck in one month. Advised patient due to location and edema of the lower legs ulceration will take longer to heal.   Return in about 1 month (around 03/21/2021).  Luther Redo, CMA, am acting as scribe for Sarina Ser, MD . Documentation: I have reviewed the above documentation for accuracy and completeness, and I agree with the above.  Sarina Ser, MD

## 2021-02-27 ENCOUNTER — Encounter: Payer: Self-pay | Admitting: Dermatology

## 2021-03-26 ENCOUNTER — Other Ambulatory Visit: Payer: Self-pay

## 2021-03-26 ENCOUNTER — Ambulatory Visit: Payer: BC Managed Care – PPO | Admitting: Dermatology

## 2021-03-26 DIAGNOSIS — Z85828 Personal history of other malignant neoplasm of skin: Secondary | ICD-10-CM

## 2021-03-26 NOTE — Progress Notes (Signed)
° °  Follow-Up Visit   Subjective  Destiny Holland is a 66 y.o. female who presents for the following: Skin Cancer (4 weeks f/u left medial pretibial biopsy proven SCC removed 12/11/20 excised 01/20/2021).  The following portions of the chart were reviewed this encounter and updated as appropriate:   Allergies   Meds   Problems   Med Hx   Surg Hx   Fam Hx      Review of Systems:  No other skin or systemic complaints except as noted in HPI or Assessment and Plan.  Objective  Well appearing patient in no apparent distress; mood and affect are within normal limits.  A focused examination was performed including left lower leg. Relevant physical exam findings are noted in the Assessment and Plan.  left medial pretibial Well healed scar with no evidence of recurrence, no lymphadenopathy.    Assessment & Plan  History of SCC (squamous cell carcinoma) of skin left medial pretibial  Clear.  Continues to heal with ulcer remaining.  Patient no longer has any pain in the area.  Observe for recurrence. Call clinic for new or changing lesions.  Recommend regular skin exams, daily broad-spectrum spf 30+ sunscreen use, and photoprotection.     Return in about 3 months (around 07/01/2021) for Hx of SCC, TBSE .  IMarye Round, CMA, am acting as scribe for Sarina Ser, MD .  Documentation: I have reviewed the above documentation for accuracy and completeness, and I agree with the above.  Sarina Ser, MD

## 2021-03-26 NOTE — Patient Instructions (Signed)

## 2021-03-30 ENCOUNTER — Encounter: Payer: Self-pay | Admitting: Dermatology

## 2021-04-16 ENCOUNTER — Other Ambulatory Visit (HOSPITAL_COMMUNITY): Payer: Self-pay | Admitting: Internal Medicine

## 2021-04-16 ENCOUNTER — Other Ambulatory Visit: Payer: Self-pay | Admitting: Internal Medicine

## 2021-04-16 DIAGNOSIS — R600 Localized edema: Secondary | ICD-10-CM

## 2021-04-17 ENCOUNTER — Ambulatory Visit
Admission: RE | Admit: 2021-04-17 | Discharge: 2021-04-17 | Disposition: A | Discharge status: Home / Self Care | Payer: BC Managed Care – PPO | Source: Ambulatory Visit | Attending: Internal Medicine | Admitting: Internal Medicine

## 2021-04-17 ENCOUNTER — Other Ambulatory Visit: Payer: Self-pay

## 2021-04-17 DIAGNOSIS — R600 Localized edema: Secondary | ICD-10-CM | POA: Diagnosis present

## 2021-05-11 ENCOUNTER — Ambulatory Visit: Payer: BC Managed Care – PPO | Admitting: Dermatology

## 2021-05-11 ENCOUNTER — Other Ambulatory Visit: Payer: Self-pay

## 2021-05-11 DIAGNOSIS — L97821 Non-pressure chronic ulcer of other part of left lower leg limited to breakdown of skin: Secondary | ICD-10-CM | POA: Diagnosis not present

## 2021-05-11 DIAGNOSIS — Z85828 Personal history of other malignant neoplasm of skin: Secondary | ICD-10-CM

## 2021-05-11 DIAGNOSIS — B965 Pseudomonas (aeruginosa) (mallei) (pseudomallei) as the cause of diseases classified elsewhere: Secondary | ICD-10-CM | POA: Diagnosis not present

## 2021-05-11 DIAGNOSIS — L98491 Non-pressure chronic ulcer of skin of other sites limited to breakdown of skin: Secondary | ICD-10-CM

## 2021-05-11 DIAGNOSIS — L03116 Cellulitis of left lower limb: Secondary | ICD-10-CM | POA: Diagnosis not present

## 2021-05-11 MED ORDER — CIPROFLOXACIN HCL 250 MG PO TABS: 250.0000 mg | ORAL_TABLET | Freq: Two times a day (BID) | ORAL | 0 refills | Status: DC

## 2021-05-11 NOTE — Patient Instructions (Signed)

## 2021-05-11 NOTE — Progress Notes (Signed)
° °  Follow-Up Visit   Subjective  Destiny Holland is a 66 y.o. female who presents for the following: Other (Recheck SCC site of left pretibial - not healed). She has swelling of legs.  The following portions of the chart were reviewed this encounter and updated as appropriate:   Allergies   Meds   Problems   Med Hx   Surg Hx   Fam Hx      Review of Systems:  No other skin or systemic complaints except as noted in HPI or Assessment and Plan.  Objective  Well appearing patient in no apparent distress; mood and affect are within normal limits.  A focused examination was performed including left leg. Relevant physical exam findings are noted in the Assessment and Plan.  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 Left Lower Leg - Anterior  History of SCC with some cellulitis  Cipro 250 mg 1 po bid x 1 week  Recheck on follow up. May consider Duoderm + unna boot on follow up vs referral to wound clinic.  ciprofloxacin (CIPRO) 250 MG tablet - Left Lower Leg - Anterior Take 1 tablet (250 mg total) by mouth 2 (two) times daily.  Return in about 1 week (around 05/18/2021).  I, Ashok Cordia, CMA, am acting as scribe for Sarina Ser, MD . Documentation: I have reviewed the above documentation for accuracy and completeness, and I agree with the above.  Sarina Ser, MD

## 2021-05-14 ENCOUNTER — Encounter: Payer: Self-pay | Admitting: Dermatology

## 2021-05-19 ENCOUNTER — Ambulatory Visit (INDEPENDENT_AMBULATORY_CARE_PROVIDER_SITE_OTHER): Payer: Medicare Other | Admitting: Dermatology

## 2021-05-19 ENCOUNTER — Other Ambulatory Visit: Payer: Self-pay

## 2021-05-19 DIAGNOSIS — L97821 Non-pressure chronic ulcer of other part of left lower leg limited to breakdown of skin: Secondary | ICD-10-CM | POA: Diagnosis not present

## 2021-05-19 DIAGNOSIS — Z85828 Personal history of other malignant neoplasm of skin: Secondary | ICD-10-CM

## 2021-05-19 DIAGNOSIS — B965 Pseudomonas (aeruginosa) (mallei) (pseudomallei) as the cause of diseases classified elsewhere: Secondary | ICD-10-CM | POA: Diagnosis not present

## 2021-05-19 DIAGNOSIS — L97921 Non-pressure chronic ulcer of unspecified part of left lower leg limited to breakdown of skin: Secondary | ICD-10-CM

## 2021-05-19 NOTE — Patient Instructions (Signed)

## 2021-05-19 NOTE — Progress Notes (Signed)
? ?  Follow-Up Visit ?  ?Subjective  ?Destiny Holland is a 66 y.o. female who presents for the following: Hx of SCC w/ persistent ulcer and Pseudomonas colonization/ (L lower leg, 1 wk f/u, finished Cipro '250mg'$  1 po bid x 1 wk, pt is not able to sit with legs elevated due t working all day and then has to take care of her mom when she gets home from work). ? ?The following portions of the chart were reviewed this encounter and updated as appropriate:  ? Allergies  Meds  Problems  Med Hx  Surg Hx  Fam Hx   ?  ?Review of Systems:  No other skin or systemic complaints except as noted in HPI or Assessment and Plan. ? ?Objective  ?Well appearing patient in no apparent distress; mood and affect are within normal limits. ? ?A focused examination was performed including left lower leg. Relevant physical exam findings are noted in the Assessment and Plan. ? ?Left Lower Leg - Anterior ?3.0 x 2.5cm ulcer with granulation tissue ? ? ? ? ? ?Assessment & Plan  ?History of SCC (squamous cell carcinoma) of skin ?Left Lower Leg - Anterior ? ?With persistent ulcer and Pseudomonas colonization/cellulitis and slow healing 2ndary to stasis changes. ? ?Discussed duoderm and unna boot or can refer pt to Wound clinic ? ?Wound cleaned today with Puracyn, Duoderm applied followed by unna boot and coban wrap.  Pt instructed not to get wet.  Pt instructed she may remove boot (by unwrapping) next week prior to f/u appointment if she would like to shower before the next boot applied. ? ?Related Medications ?ciprofloxacin (CIPRO) 250 MG tablet ?Take 1 tablet (250 mg total) by mouth 2 (two) times daily. ? ? ?Return in about 1 week (around 05/26/2021) for unna boot. ? ?I, Destiny Holland, RMA, am acting as scribe for Destiny Ser, MD . ?Documentation: I have reviewed the above documentation for accuracy and completeness, and I agree with the above. ? ?Destiny Ser, MD ? ?

## 2021-05-21 ENCOUNTER — Encounter: Payer: Self-pay | Admitting: Dermatology

## 2021-05-21 ENCOUNTER — Telehealth: Payer: Self-pay

## 2021-05-21 DIAGNOSIS — Z85828 Personal history of other malignant neoplasm of skin: Secondary | ICD-10-CM

## 2021-05-21 MED ORDER — CIPROFLOXACIN HCL 250 MG PO TABS: 250.0000 mg | ORAL_TABLET | Freq: Two times a day (BID) | ORAL | 0 refills | Status: AC

## 2021-05-21 NOTE — Telephone Encounter (Signed)
Patient called stating that wound has drainage that has soaked through the Duoderm and unna boot. She is concerned and wants to know if this is normal. She had to put another bandage over site since it is soaking through. Please advise.  ?

## 2021-05-21 NOTE — Telephone Encounter (Signed)
Patient informed of instructions and refill sent to pharmacy.  ?

## 2021-05-27 ENCOUNTER — Ambulatory Visit (INDEPENDENT_AMBULATORY_CARE_PROVIDER_SITE_OTHER): Payer: Medicare Other | Admitting: Dermatology

## 2021-05-27 ENCOUNTER — Other Ambulatory Visit: Payer: Self-pay

## 2021-05-27 DIAGNOSIS — L98491 Non-pressure chronic ulcer of skin of other sites limited to breakdown of skin: Secondary | ICD-10-CM

## 2021-05-27 DIAGNOSIS — L97821 Non-pressure chronic ulcer of other part of left lower leg limited to breakdown of skin: Secondary | ICD-10-CM | POA: Diagnosis not present

## 2021-05-27 DIAGNOSIS — Z85828 Personal history of other malignant neoplasm of skin: Secondary | ICD-10-CM | POA: Diagnosis not present

## 2021-05-27 DIAGNOSIS — B965 Pseudomonas (aeruginosa) (mallei) (pseudomallei) as the cause of diseases classified elsewhere: Secondary | ICD-10-CM

## 2021-05-27 MED ORDER — TOBRAMYCIN 0.3 % OP SOLN: OPHTHALMIC | 1 refills | Status: DC

## 2021-05-27 MED ORDER — XEPI 1 % EX CREA: TOPICAL_CREAM | CUTANEOUS | 1 refills | Status: DC

## 2021-05-27 NOTE — Patient Instructions (Signed)

## 2021-05-27 NOTE — Progress Notes (Signed)
? ?  Follow-Up Visit ?  ?Subjective  ?Destiny Holland is a 66 y.o. female who presents for the following: Follow-up (1 week f/u Hx of SCC w/ persistent ulcer and Pseudomonas colonization/ (L lower leg, pt had a DuoDerm pad and Unna boot applied 05/19/2021 pt removed the Unna boot  05/22/2021 because the ulcer was leaking through the unna boot ). ? ?The following portions of the chart were reviewed this encounter and updated as appropriate:  ? Allergies  Meds  Problems  Med Hx  Surg Hx  Fam Hx   ?  ?Review of Systems:  No other skin or systemic complaints except as noted in HPI or Assessment and Plan. ? ?Objective  ?Well appearing patient in no apparent distress; mood and affect are within normal limits. ? ?A focused examination was performed including left lower leg. Relevant physical exam findings are noted in the Assessment and Plan. ? ?Left Lower Leg - Anterior ?3.0 x 2.5cm ulcer with granulation tissue ? ? 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 ?History of SCC (squamous cell carcinoma) of skin ?  ?With persistent ulcer and Pseudomonas colonization/cellulitis and slow healing 2ndary to stasis changes. ?  ?She could not tolerate the DuoDERM with Unna boot and Coban wrap as it was weeping and leaking.  She removed it a few days ago ? ?Wound debrided  today with Puracyn, applied plain Vaseline and non stick gauge then coban wrap change daily ? ?Start Tobramycin solution apply to ulcer daily ?Xepi cream prescribed today, pt will only use if she get any redness around the ulcer ? ?Ordered a Juxtalite ordered, apply to left leg daily after cleaning wrapping the ulcer ? ?Related Medications ?Ozenoxacin (XEPI) 1 % CREA ?Apply to affected skin once a day ? ?tobramycin (TOBREX) 0.3 % ophthalmic solution ?Apply 1 drop before each dressing ? ?Return in about 4 weeks (around 06/24/2021) for ulcer . ? ?I, Marye Round, CMA, am  acting as scribe for Destiny Ser, MD .  ?Documentation: I have reviewed the above documentation for accuracy and completeness, and I agree with the above. ? ?Destiny Ser, MD ? ?

## 2021-06-02 ENCOUNTER — Encounter: Payer: Self-pay | Admitting: Dermatology

## 2021-06-09 ENCOUNTER — Other Ambulatory Visit: Payer: Self-pay | Admitting: Internal Medicine

## 2021-06-09 DIAGNOSIS — Z1231 Encounter for screening mammogram for malignant neoplasm of breast: Secondary | ICD-10-CM

## 2021-07-01 ENCOUNTER — Ambulatory Visit: Payer: Medicare Other | Admitting: Dermatology

## 2021-07-01 ENCOUNTER — Ambulatory Visit (INDEPENDENT_AMBULATORY_CARE_PROVIDER_SITE_OTHER): Payer: Medicare Other | Admitting: Dermatology

## 2021-07-01 DIAGNOSIS — L98491 Non-pressure chronic ulcer of skin of other sites limited to breakdown of skin: Secondary | ICD-10-CM

## 2021-07-01 DIAGNOSIS — L258 Unspecified contact dermatitis due to other agents: Secondary | ICD-10-CM | POA: Diagnosis not present

## 2021-07-01 DIAGNOSIS — C44729 Squamous cell carcinoma of skin of left lower limb, including hip: Secondary | ICD-10-CM

## 2021-07-01 NOTE — Progress Notes (Signed)
? ?  Follow-Up Visit ?  ?Subjective  ?Destiny Holland is a 66 y.o. female who presents for the following: Ulcer (Healing SCC site at the L med pretibial - patient stopped using Vaseline because she thinks she may be allergic to it. She experienced erythema, burning, and stinging where ever she applied it, even to the eyes. The Xepi cream was too expensive, and she did use the Tobramycin drops daily but stopped using it last week. She was unable to use the compression stocking because it caused swelling in her foot. ). Patient also experienced a rash after using Coban for a few days so now she uses regular bandages.  ? ?The following portions of the chart were reviewed this encounter and updated as appropriate:  ? Allergies  Meds  Problems  Med Hx  Surg Hx  Fam Hx   ?  ?Review of Systems:  No other skin or systemic complaints except as noted in HPI or Assessment and Plan. ? ?Objective  ?Well appearing patient in no apparent distress; mood and affect are within normal limits. ? ?A focused examination was performed including the L lower leg. Relevant physical exam findings are noted in the Assessment and Plan. ? ?L med pretibial ?2.5 x 2.0 cm Healing ulceration with granulation tissue. ? ? ? ? ? 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 ?Continue wound care daily. Recommend sleeping in compression stocking since patient experienced swelling in the foot during the day the last time she wore it.  ? ?Area cleansed with Puracyn spray, Neosporin (patient brought from home) applied to aa and covered with non-stick gauze. Then wrapped with cotton gauze.  ? ?Related Medications ?Ozenoxacin (XEPI) 1 % CREA ?Apply to affected skin once a day ? ?tobramycin (TOBREX) 0.3 % ophthalmic solution ?Apply 1 drop before each  dressing ? ?Contact dermatitis due to other agent, unspecified contact dermatitis type ?L lower leg ?Hx of rash after using Vaseline at healing ulceration site and eyelids. Patient is also allergic to Mupirocin 2% ointment. 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  ? ?Return in about 1 month (around 07/31/2021) for wound recheck . ? ?IRudell Cobb, CMA, am acting as scribe for Sarina Ser, MD . ?Documentation: I have reviewed the above documentation for accuracy and completeness, and I agree with the above. ? ?Sarina Ser, MD ? ?

## 2021-07-01 NOTE — Patient Instructions (Signed)

## 2021-07-10 ENCOUNTER — Encounter: Payer: Self-pay | Admitting: Dermatology

## 2021-07-16 ENCOUNTER — Ambulatory Visit
Admission: RE | Admit: 2021-07-16 | Discharge: 2021-07-16 | Disposition: A | Discharge status: Home / Self Care | Payer: Medicare Other | Source: Ambulatory Visit | Attending: Internal Medicine | Admitting: Internal Medicine

## 2021-07-16 DIAGNOSIS — Z1231 Encounter for screening mammogram for malignant neoplasm of breast: Secondary | ICD-10-CM | POA: Insufficient documentation

## 2021-07-20 ENCOUNTER — Other Ambulatory Visit: Payer: Self-pay | Admitting: Internal Medicine

## 2021-07-20 DIAGNOSIS — R921 Mammographic calcification found on diagnostic imaging of breast: Secondary | ICD-10-CM

## 2021-07-20 DIAGNOSIS — R928 Other abnormal and inconclusive findings on diagnostic imaging of breast: Secondary | ICD-10-CM

## 2021-08-06 ENCOUNTER — Ambulatory Visit (INDEPENDENT_AMBULATORY_CARE_PROVIDER_SITE_OTHER): Payer: Medicare Other | Admitting: Dermatology

## 2021-08-06 DIAGNOSIS — L928 Other granulomatous disorders of the skin and subcutaneous tissue: Secondary | ICD-10-CM | POA: Diagnosis not present

## 2021-08-06 DIAGNOSIS — L98491 Non-pressure chronic ulcer of skin of other sites limited to breakdown of skin: Secondary | ICD-10-CM

## 2021-08-06 NOTE — Progress Notes (Signed)
   Follow-Up Visit   Subjective  Destiny Holland is a 66 y.o. female who presents for the following: Follow-up (Patient here today for 1 month follow up for ulcer at left medial pretibial. Patient currently only using neosporin. ). Patient advises she could not use Vaseline due to burning and has stopped tobramycin drops. She was also unable to use coban daily.   The following portions of the chart were reviewed this encounter and updated as appropriate:   Allergies  Meds  Problems  Med Hx  Surg Hx  Fam Hx     Review of Systems:  No other skin or systemic complaints except as noted in HPI or Assessment and Plan.  Objective  Well appearing patient in no apparent distress; mood and affect are within normal limits.  A focused examination was performed including left lower leg. Relevant physical exam findings are noted in the Assessment and Plan.  left medial pretibial Exuberant granulation tissue       Assessment & Plan  Non-pressure chronic ulcer of skin of other sites limited to breakdown of skin  With exuberant granulation tissue  site of SCC excision -patient healing slowly due to lower leg location and chronic edema of the lower leg left medial pretibial  Wound and granulation tissue debrided today with silver nitrate EpiCeram applied and covered with non-stick gauze and wrapped with coban.  Sample of EpiCeram given to patient. Lot # [id number]  Exp: 02/2023  Continue leg elevation and graduated compression stockings.  Return in about 4 weeks (around 09/03/2021).  Graciella Belton, RMA, am acting as scribe for Sarina Ser, MD . Documentation: I have reviewed the above documentation for accuracy and completeness, and I agree with the above.  Sarina Ser, MD

## 2021-08-06 NOTE — Patient Instructions (Signed)

## 2021-08-10 ENCOUNTER — Encounter: Payer: Self-pay | Admitting: Dermatology

## 2021-08-18 ENCOUNTER — Ambulatory Visit
Admission: RE | Admit: 2021-08-18 | Discharge: 2021-08-18 | Disposition: A | Discharge status: Home / Self Care | Payer: Medicare Other | Source: Ambulatory Visit | Attending: Internal Medicine | Admitting: Internal Medicine

## 2021-08-18 DIAGNOSIS — R921 Mammographic calcification found on diagnostic imaging of breast: Secondary | ICD-10-CM | POA: Diagnosis present

## 2021-08-18 DIAGNOSIS — R928 Other abnormal and inconclusive findings on diagnostic imaging of breast: Secondary | ICD-10-CM | POA: Insufficient documentation

## 2021-08-20 ENCOUNTER — Other Ambulatory Visit: Payer: Self-pay | Admitting: Internal Medicine

## 2021-08-20 DIAGNOSIS — R921 Mammographic calcification found on diagnostic imaging of breast: Secondary | ICD-10-CM

## 2021-08-20 DIAGNOSIS — R928 Other abnormal and inconclusive findings on diagnostic imaging of breast: Secondary | ICD-10-CM

## 2021-09-02 ENCOUNTER — Ambulatory Visit (INDEPENDENT_AMBULATORY_CARE_PROVIDER_SITE_OTHER): Payer: Medicare Other | Admitting: Dermatology

## 2021-09-02 DIAGNOSIS — Z85828 Personal history of other malignant neoplasm of skin: Secondary | ICD-10-CM | POA: Diagnosis not present

## 2021-09-02 DIAGNOSIS — L578 Other skin changes due to chronic exposure to nonionizing radiation: Secondary | ICD-10-CM | POA: Diagnosis not present

## 2021-09-02 DIAGNOSIS — L98491 Non-pressure chronic ulcer of skin of other sites limited to breakdown of skin: Secondary | ICD-10-CM

## 2021-09-02 NOTE — Patient Instructions (Signed)
Due to recent changes in healthcare laws, you may see results of your pathology and/or laboratory studies on MyChart before the doctors have had a chance to review them. We understand that in some cases there may be results that are confusing or concerning to you. Please understand that not all results are received at the same time and often the doctors may need to interpret multiple results in order to provide you with the best plan of care or course of treatment. Therefore, we ask that you please give us 2 business days to thoroughly review all your results before contacting the office for clarification. Should we see a critical lab result, you will be contacted sooner.   If You Need Anything After Your Visit  If you have any questions or concerns for your doctor, please call our main line at [phone] and press option 4 to reach your doctor's medical assistant. If no one answers, please leave a voicemail as directed and we will return your call as soon as possible. Messages left after 4 pm will be answered the following business day.   You may also send us a message via MyChart. We typically respond to MyChart messages within 1-2 business days.  For prescription refills, please ask your pharmacy to contact our office. Our fax number is [phone].  If you have an urgent issue when the clinic is closed that cannot wait until the next business day, you can page your doctor at the number below.    Please note that while we do our best to be available for urgent issues outside of office hours, we are not available 24/7.   If you have an urgent issue and are unable to reach us, you may choose to seek medical care at your doctor's office, retail clinic, urgent care center, or emergency room.  If you have a medical emergency, please immediately call 911 or go to the emergency department.  Pager Numbers  - Dr. Kowalski: [phone]  - Dr. Moye: [phone]  - Dr. Stewart:  [phone]  In the event of inclement weather, please call our main line at [phone] for an update on the status of any delays or closures.  Dermatology Medication Tips: Please keep the boxes that topical medications come in in order to help keep track of the instructions about where and how to use these. Pharmacies typically print the medication instructions only on the boxes and not directly on the medication tubes.   If your medication is too expensive, please contact our office at [phone] option 4 or send us a message through MyChart.   We are unable to tell what your co-pay for medications will be in advance as this is different depending on your insurance coverage. However, we may be able to find a substitute medication at lower cost or fill out paperwork to get insurance to cover a needed medication.   If a prior authorization is required to get your medication covered by your insurance company, please allow us 1-2 business days to complete this process.  Drug prices often vary depending on where the prescription is filled and some pharmacies may offer cheaper prices.  The website www.goodrx.com contains coupons for medications through different pharmacies. The prices here do not account for what the cost may be with help from insurance (it may be cheaper with your insurance), but the website can give you the price if you did not use any insurance.  - You can print the associated coupon and take it with   your prescription to the pharmacy.  - You may also stop by our office during regular business hours and pick up a GoodRx coupon card.  - If you need your prescription sent electronically to a different pharmacy, notify our office through [place] MyChart or by phone at [phone] option 4.     Si Usted Necesita Algo Despus de Su Visita  Tambin puede enviarnos un mensaje a travs de MyChart. Por lo general respondemos a los mensajes de MyChart en el transcurso de 1 a 2  das hbiles.  Para renovar recetas, por favor pida a su farmacia que se ponga en contacto con nuestra oficina. Nuestro nmero de fax es el [phone].  Si tiene un asunto urgente cuando la clnica est cerrada y que no puede esperar hasta el siguiente da hbil, puede llamar/localizar a su doctor(a) al nmero que aparece a continuacin.   Por favor, tenga en cuenta que aunque hacemos todo lo posible para estar disponibles para asuntos urgentes fuera del horario de oficina, no estamos disponibles las 24 horas del da, los 7 das de la semana.   Si tiene un problema urgente y no puede comunicarse con nosotros, puede optar por buscar atencin mdica  en el consultorio de su doctor(a), en una clnica privada, en un centro de atencin urgente o en una sala de emergencias.  Si tiene una emergencia mdica, por favor llame inmediatamente al 911 o vaya a la sala de emergencias.  Nmeros de bper  - Dr. Kowalski: [phone]  - Dra. Moye: [phone]  - Dra. Stewart: [phone]  En caso de inclemencias del tiempo, por favor llame a nuestra lnea principal al [phone] para una actualizacin sobre el estado de cualquier retraso o cierre.  Consejos para la medicacin en dermatologa: Por favor, guarde las cajas en las que vienen los medicamentos de uso tpico para ayudarle a seguir las instrucciones sobre dnde y cmo usarlos. Las farmacias generalmente imprimen las instrucciones del medicamento slo en las cajas y no directamente en los tubos del medicamento.   Si su medicamento es muy caro, por favor, pngase en contacto con nuestra oficina llamando al [phone] y presione la opcin 4 o envenos un mensaje a travs de MyChart.   No podemos decirle cul ser su copago por los medicamentos por adelantado ya que esto es diferente dependiendo de la cobertura de su seguro. Sin embargo, es posible que podamos encontrar un medicamento sustituto a menor costo o llenar un formulario para que el  seguro cubra el medicamento que se considera necesario.   Si se requiere una autorizacin previa para que su compaa de seguros cubra su medicamento, por favor permtanos de 1 a 2 das hbiles para completar este proceso.  Los precios de los medicamentos varan con frecuencia dependiendo del lugar de dnde se surte la receta y alguna farmacias pueden ofrecer precios ms baratos.  El sitio web www.goodrx.com tiene cupones para medicamentos de diferentes farmacias. Los precios aqu no tienen en cuenta lo que podra costar con la ayuda del seguro (puede ser ms barato con su seguro), pero el sitio web puede darle el precio si no utiliz ningn seguro.  - Puede imprimir el cupn correspondiente y llevarlo con su receta a la farmacia.  - Tambin puede pasar por nuestra oficina durante el horario de atencin regular y recoger una tarjeta de cupones de GoodRx.  - Si necesita que su receta se enve electrnicamente a una farmacia diferente, informe a nuestra oficina a travs de MyChart de [place]   o por telfono llamando al [phone] y presione la opcin 4.  

## 2021-09-02 NOTE — Progress Notes (Unsigned)
   Follow-Up Visit   Subjective  Destiny Holland is a 66 y.o. female who presents for the following: Follow-up. (Patient here today for 1 month follow up for ulcer at left medial pretibial. Patient currently only using neosporin. ). Patient advises she could not use Vaseline due to burning and has stopped tobramycin drops. She was also unable to use coban daily.     The following portions of the chart were reviewed this encounter and updated as appropriate:       Review of Systems:  No other skin or systemic complaints except as noted in HPI or Assessment and Plan.  Objective  Well appearing patient in no apparent distress; mood and affect are within normal limits.  A focused examination was performed including left leg. Relevant physical exam findings are noted in the Assessment and Plan.  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     Continue leg elevation and graduated compression stockings.   Return in about 6 weeks (around 10/14/2021) for Ulcer .  IMarye Round, CMA, am acting as scribe for Sarina Ser, MD .

## 2021-09-04 ENCOUNTER — Encounter: Payer: Self-pay | Admitting: Dermatology

## 2021-10-01 ENCOUNTER — Inpatient Hospital Stay: Admission: RE | Admit: 2021-10-01 | Payer: Medicare Other | Source: Ambulatory Visit

## 2021-10-22 ENCOUNTER — Ambulatory Visit
Admission: RE | Admit: 2021-10-22 | Discharge: 2021-10-22 | Disposition: A | Discharge status: Home / Self Care | Payer: Medicare Other | Source: Ambulatory Visit | Attending: Internal Medicine | Admitting: Internal Medicine

## 2021-10-22 DIAGNOSIS — R921 Mammographic calcification found on diagnostic imaging of breast: Secondary | ICD-10-CM

## 2021-10-22 DIAGNOSIS — R928 Other abnormal and inconclusive findings on diagnostic imaging of breast: Secondary | ICD-10-CM | POA: Diagnosis not present

## 2021-10-22 HISTORY — PX: BREAST BIOPSY: SHX20

## 2021-10-23 LAB — SURGICAL PATHOLOGY

## 2021-10-28 ENCOUNTER — Ambulatory Visit: Payer: Medicare Other | Admitting: Dermatology

## 2022-03-25 ENCOUNTER — Other Ambulatory Visit: Payer: Self-pay | Admitting: Internal Medicine

## 2022-03-25 DIAGNOSIS — L929 Granulomatous disorder of the skin and subcutaneous tissue, unspecified: Secondary | ICD-10-CM | POA: Diagnosis not present

## 2022-03-25 DIAGNOSIS — R921 Mammographic calcification found on diagnostic imaging of breast: Secondary | ICD-10-CM

## 2022-04-14 ENCOUNTER — Encounter: Payer: Medicare HMO | Attending: Internal Medicine | Admitting: Internal Medicine

## 2022-04-14 DIAGNOSIS — I10 Essential (primary) hypertension: Secondary | ICD-10-CM

## 2022-04-14 DIAGNOSIS — C4492 Squamous cell carcinoma of skin, unspecified: Secondary | ICD-10-CM | POA: Insufficient documentation

## 2022-04-14 NOTE — Progress Notes (Signed)
HUNTER, PINKARD ([id number]) [id number] Page 1 of 6 Visit Report for 04/14/2022 Chief Complaint Document Details Patient Name: Date of Service: SHO Destiny Holland 04/14/2022 8:45 A M Medical Record Number: [id number] Patient Account Number: [id number] Date of Birth/Sex: Treating RN: Aug 11, 1955 (67 y.o. 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 Electronic Signature(s) Signed: 04/14/2022 11:14:54 AM By: Kalman Shan DO Entered By: Kalman Shan on 04/14/2022 09:27:11 -------------------------------------------------------------------------------- HPI Details Patient Name: Date of Service: SHO Gabriela Eves, Larene Pickett. 04/14/2022 8:45 A M Medical Record Number: [id number] Patient Account Number: [id number] Date of Birth/Sex: Treating RN: Jan 05, 1956 (67 y.o. Orvan Falconer Primary Care Provider: Harrel Lemon Other Clinician: Referring Provider: Treating Provider/Extender: Kalman Shan Self, Referral Weeks in Treatment: 0 History of Present Illness HPI Description: 04/14/2022 Ms. 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 Patient had removal of the squamous cell carcinoma lesion in 2022. The surgical site persisted with hyper granulated tissue. She has had removal of this area with no recurrence of malignancy. She follows with dermatology for this issue. She has tried clobetasol. She has also had this area silver nitrated. She still has a small raised lesion to the area. No open wound. She follows with dermatology on February 8 for  follow-up and plan is for steroid injection. Electronic Signature(s) Signed: 04/14/2022 11:14:54 AM By: Kalman Shan DO Entered By: Kalman Shan on 04/14/2022 09:29:12 Holland, Destiny Holland ([id number]) [id number] Page 2 of 6 -------------------------------------------------------------------------------- Physical Exam Details Patient Name: Date of Service: SHO Destiny Holland 04/14/2022 8:45 A M Medical Record Number: [id number] Patient Account Number: [id number] Date of Birth/Sex: Treating RN: March 15, 1956 (67 y.o. Orvan Falconer Primary Care Provider: Harrel Lemon Other Clinician: Referring Provider: Treating Provider/Extender: Kalman Shan Self, Referral Weeks in Treatment: 0 Constitutional . Cardiovascular . Psychiatric . Notes Left lower extremity: T the distal medial aspect there is a small red raised lesion with fluctuance. No signs of surrounding infection. No open wound. o Electronic Signature(s) Signed: 04/14/2022 11:14:54 AM By: Kalman Shan DO Entered By: Kalman Shan on 04/14/2022 09:29:48 -------------------------------------------------------------------------------- Physician Orders Details Patient Name: Date of Service: SHO Destiny Holland. 04/14/2022 8:45 A M Medical Record Number: [id number] Patient Account Number: [id number] Date of Birth/Sex: Treating RN: [birth year] (67 y.o. Orvan Falconer Primary Care Provider: Harrel Lemon Other Clinician: Referring Provider: Treating Provider/Extender: Kalman Shan Self, Referral Weeks in Treatment: 0 Verbal / Phone Orders: No Diagnosis Coding ICD-10 Coding Code Description C44.92 Squamous cell carcinoma of skin, unspecified I10 Essential (primary) hypertension Discharge From [place] Only Electronic Signature(s) Signed: 04/14/2022 11:14:54 AM By: Kalman Shan DO Holland, Destiny Holland ([id number])VW By: Kalman Shan DO  [id number] Page 3 of 6 Signed: 04/14/2022 11:14:54 Entered By: Kalman Shan on 04/14/2022 09:32:23 -------------------------------------------------------------------------------- Problem List Details Patient Name: Date of Service: SHO Destiny Holland 04/14/2022 8:45 A M Medical Record Number: [id number] Patient Account Number: [id number] Date of Birth/Sex: Treating RN: 1955-11-17 (67 y.o. Orvan Falconer Primary Care Provider: Harrel Lemon Other Clinician: Referring Provider: Treating Provider/Extender: Kalman Shan Self, Referral Weeks in Treatment: 0 Active Problems ICD-10 Encounter Code Description Active Date MDM Diagnosis C44.92 Squamous  cell carcinoma of skin, unspecified 04/14/2022 No Yes I10 Essential (primary) hypertension 04/14/2022 No Yes Inactive Problems Resolved Problems Electronic Signature(s) Signed: 04/14/2022 11:14:54 AM By: Kalman Shan DO Entered By: Kalman Shan on 04/14/2022 09:26:36 -------------------------------------------------------------------------------- Progress Note Details Patient Name: Date of Service: SHO Destiny Holland. 04/14/2022 8:45 A M Medical Record Number: [id number] Patient Account Number: [id number] Date of Birth/Sex: Treating RN: September 23, 1955 (67 y.o. 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 History of Present Illness (HPI) 04/14/2022 Ms. 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 Patient had removal of the squamous cell carcinoma lesion in 2022. The surgical site persisted with hyper granulated tissue. She has had removal of this area with no recurrence of malignancy. She follows with dermatology for this issue. She has tried clobetasol. She has also had this area silver nitrated. She still has a small raised lesion to the area. No open wound. She follows with dermatology on February 8 for follow-up and plan is for steroid injection. Patient History Allergies adhesive tape, mupirocin, cefdinir, penicillin Social History Never smoker, Marital Status - Divorced, Alcohol Use - Rarely, Drug Use - No History, Caffeine Use - Never. Medical History Cardiovascular Patient has history of Hypertension Review of Systems (ROS) Integumentary (Skin) Complains or has symptoms of Wounds, Swelling. Objective Constitutional Vitals Time Taken: 8:59 AM, Height: 67 in, Source: Stated, Weight: 185 lbs, Source: Stated, BMI: 29, Temperature: 97.5 F, Pulse: 85 bpm, Respiratory Rate: 18 breaths/min, Blood Pressure: 174/81 mmHg. General Notes: Left lower extremity: T the distal medial aspect there is a small red raised lesion with fluctuance. No signs of surrounding infection. No open o wound. Assessment Active Problems ICD-10 Squamous cell carcinoma of skin, unspecified Essential (primary) hypertension Patient presents with a small lesion to her left lower extremity to a previous squamous cell carcinoma lesion site. The lesion is reoccurring. Since the original removal of the [facility] she has had this area removed twice however it continues to grow back. There is no open wound. I recommended she follow-up with dermatology for this issue. Plan is for steroid injection. Unfortunately there is nothing further to do from a wound care point of view. She may follow-up here as needed. Plan Discharge From [facility] Services: Consult Only 1. Follow-up with dermatology 2. Follow-up in  the wound care center as needed Electronic Signature(s) Signed: 04/14/2022 11:14:54 AM By: Kalman Shan DO Entered By: Kalman Shan on 04/14/2022 09:32:02 Destiny Holland, Destiny Holland ([id number]) [id number] Page 5 of 6 -------------------------------------------------------------------------------- ROS/PFSH Details Patient Name: Date of Service: SHO Destiny Holland 04/14/2022 8:45 A M Medical Record Number: [id number] Patient Account Number: [id number] Date of Birth/Sex: Treating RN: Jul 22, 1955 (67 y.o. Orvan Falconer Primary Care Provider: Harrel Lemon Other Clinician: Referring Provider: Treating Provider/Extender: Kalman Shan Self, Referral Weeks in Treatment: 0 Integumentary (Skin) Complaints and Symptoms: Positive for: Wounds; Swelling Cardiovascular Medical History: Positive for: Hypertension Oncologic Immunizations Pneumococcal Vaccine: Received Pneumococcal Vaccination: No Implantable Devices None Family and Social History Never smoker; Marital Status - Divorced; Alcohol Use: Rarely; Drug Use: No History; Caffeine Use: Never; Financial Concerns: No; Food,  Clothing or Shelter Needs: No; Support System Lacking: No; Transportation Concerns: No Electronic Signature(s) Signed: 04/14/2022 11:14:54 AM By: Kalman Shan DO Signed: 04/14/2022 4:00:01 PM By: Carlene Coria RN Entered By: Carlene Coria on 04/14/2022 09:04:27 -------------------------------------------------------------------------------- SuperBill Details Patient Name: Date of Service: [facility] 04/14/2022 Medical Record Number: [id number] Patient Account Number: [id number] Date of Birth/Sex: Treating RN: Feb 19, 1956 (67 y.o. Orvan Falconer Primary Care Provider: Harrel Lemon Other Clinician: Referring Provider: Treating Provider/Extender: Kalman Shan Self, Referral Weeks in Treatment: 0 Diagnosis Coding Destiny Holland, Destiny Holland ([id number])  [id number] Page 6 of 6 ICD-10 Codes Code Description C44.92 Squamous cell carcinoma of skin, unspecified I10 Essential (primary) hypertension Facility Procedures : CPT4 Code: [id number] Description: 00867 - WOUND CARE VISIT-LEV 3 EST PT Modifier: Quantity: 1 Physician Procedures : CPT4 Code Description Modifier [id number] WC PHYS LEVEL 3 NEW PT ICD-10 Diagnosis Description C44.92 Squamous cell carcinoma of skin, unspecified I10 Essential (primary) hypertension Quantity: 1 Electronic Signature(s) Signed: 04/14/2022 11:14:54 AM By: Kalman Shan DO Entered By: Kalman Shan on 04/14/2022 09:32:17

## 2022-04-14 NOTE — Progress Notes (Signed)
Destiny Holland, Destiny Holland ([id number]) [id number] Page 1 of 7 Visit Report for 04/14/2022 Allergy List Details Patient Name: Date of Service: Destiny Nyra Market 04/14/2022 8:45 A M Medical Record Number: [id number] Patient Account Number: [id number] Date of Birth/Sex: Treating RN: 24-Jul-1955 (67 y.o. Destiny Holland Primary Care Shanicqua Coldren: Harrel Lemon Other Clinician: Referring Kathryn Cosby: Treating Shenell Rogalski/Extender: Kalman Shan Self, Referral Weeks in Treatment: 0 Allergies Active Allergies adhesive tape mupirocin cefdinir penicillin Allergy Notes Electronic Signature(s) Signed: 04/14/2022 4:00:01 PM By: Carlene Coria RN Entered By: Carlene Coria on 04/14/2022 09:02:38 -------------------------------------------------------------------------------- Arrival Information Details Patient Name: Date of Service: Destiny Confer. 04/14/2022 8:45 A M Medical Record Number: [id number] Patient Account Number: [id number] Date of Birth/Sex: Treating RN: Jul 14, 1955 (67 y.o. Destiny Holland Primary Care Torres Hardenbrook: Harrel Lemon Other Clinician: Referring Misha Vanoverbeke: Treating Jull Harral/Extender: Kalman Shan Self, Referral Weeks in Treatment: 0 Visit Information Patient Arrived: Ambulatory Arrival Time: 08:57 Accompanied By: self Transfer Assistance: None Patient Identification Verified: Yes Secondary Verification Process Completed: Yes Patient Requires Transmission-Based Precautions: No Patient Has Alerts: No Destiny Holland, Destiny Holland ([id number]) [id number] Page 2 of 7 Electronic Signature(s) Signed: 04/14/2022 4:00:01 PM By: Carlene Coria RN Entered By: Carlene Coria on 04/14/2022 08:57:52 -------------------------------------------------------------------------------- Clinic Level of Care Assessment Details Patient Name: Date of Service: Destiny Nyra Market 04/14/2022 8:45 A M Medical Record Number: [id number] Patient Account Number:  [id number] Date of Birth/Sex: Treating RN: January 20, 1956 (67 y.o. Destiny Holland Primary Care Barry Culverhouse: Harrel Lemon Other Clinician: Referring Broderick Fonseca: Treating Averil Digman/Extender: Kalman Shan Self, Referral Weeks in Treatment: 0 Clinic Level of Care Assessment Items TOOL 2 Quantity Score X- 1 0 Use when only an EandM is performed on the INITIAL visit ASSESSMENTS - Nursing Assessment / Reassessment X- 1 20 General Physical Exam (combine w/ comprehensive assessment (listed just below) when performed on new pt. evals) X- 1 25 Comprehensive Assessment (HX, ROS, Risk Assessments, Wounds Hx, etc.) ASSESSMENTS - Wound and Skin A ssessment / Reassessment '[]'$  - 0 Simple Wound Assessment / Reassessment - one wound '[]'$  - 0 Complex Wound Assessment / Reassessment - multiple wounds '[]'$  - 0 Dermatologic / Skin Assessment (not related to wound area) ASSESSMENTS - Ostomy and/or Continence Assessment and Care '[]'$  - 0 Incontinence Assessment and Management '[]'$  - 0 Ostomy Care Assessment and Management (repouching, etc.) PROCESS - Coordination of Care X - Simple Patient / Family Education for ongoing care 1 15 '[]'$  - 0 Complex (extensive) Patient / Family Education for ongoing care X- 1 10 Staff obtains Programmer, systems, Records, T Results / Process Orders est '[]'$  - 0 Staff telephones HHA, Nursing Homes / Clarify orders / etc '[]'$  - 0 Routine Transfer to another Facility (non-emergent condition) '[]'$  - 0 Routine Hospital Admission (non-emergent condition) X- 1 15 New Admissions / Biomedical engineer / Ordering NPWT Apligraf, etc. , '[]'$  - 0 Emergency Hospital Admission (emergent condition) X- 1 10 Simple Discharge Coordination '[]'$  - 0 Complex (extensive) Discharge Coordination PROCESS - Special Needs '[]'$  - 0 Pediatric / Minor Patient Management '[]'$  - 0 Isolation Patient Management '[]'$  - 0 Hearing / Language / Visual special needs '[]'$  - 0 Assessment of Community assistance (transportation, D/C  planning, etc.) Destiny Holland, Destiny Holland ([id number]) [id number] Page 3 of 7 '[]'$  - 0 Additional assistance / Altered mentation '[]'$  - 0 Support Surface(s) Assessment (bed, cushion, seat, etc.) INTERVENTIONS - Wound Cleansing / Measurement '[]'$  - 0 Wound Imaging (photographs - any number of wounds) '[]'$  - 0 Wound Tracing (instead of photographs) '[]'$  -  0 Simple Wound Measurement - one wound '[]'$  - 0 Complex Wound Measurement - multiple wounds '[]'$  - 0 Simple Wound Cleansing - one wound '[]'$  - 0 Complex Wound Cleansing - multiple wounds INTERVENTIONS - Wound Dressings '[]'$  - 0 Small Wound Dressing one or multiple wounds '[]'$  - 0 Medium Wound Dressing one or multiple wounds '[]'$  - 0 Large Wound Dressing one or multiple wounds '[]'$  - 0 Application of Medications - injection INTERVENTIONS - Miscellaneous '[]'$  - 0 External ear exam '[]'$  - 0 Specimen Collection (cultures, biopsies, blood, body fluids, etc.) '[]'$  - 0 Specimen(s) / Culture(s) sent or taken to Lab for analysis '[]'$  - 0 Patient Transfer (multiple staff / Civil Service fast streamer / Similar devices) '[]'$  - 0 Simple Staple / Suture removal (25 or less) '[]'$  - 0 Complex Staple / Suture removal (26 or more) '[]'$  - 0 Hypo / Hyperglycemic Management (close monitor of Blood Glucose) X- 1 15 Ankle / Brachial Index (ABI) - do not check if billed separately Has the patient been seen at the hospital within the last three years: Yes Total Score: 110 Level Of Care: New/Established - Level 3 Electronic Signature(s) Signed: 04/14/2022 4:00:01 PM By: Carlene Coria RN Entered By: Carlene Coria on 04/14/2022 09:24:45 -------------------------------------------------------------------------------- Encounter Discharge Information Details Patient Name: Date of Service: Destiny Holland, Destiny Pickett. 04/14/2022 8:45 A M Medical Record Number: [id number] Patient Account Number: [id number] Date of Birth/Sex: Treating RN: 1955/10/02 (67 y.o. Destiny Holland Primary Care  Shoua Ulloa: Harrel Lemon Other Clinician: Referring Lillian Ballester: Treating Geoffrey Hynes/Extender: Kalman Shan Self, Referral Weeks in Treatment: 0 Encounter Discharge Information Items Discharge Condition: Stable Ambulatory Status: Ambulatory Discharge Destination: Home Transportation: Private [address] [place], [place] ([id number]) [id number] Page 4 of 7 Accompanied By: self Schedule Follow-up Appointment: Yes Clinical Summary of Care: Electronic Signature(s) Signed: 04/14/2022 4:00:01 PM By: Carlene Coria RN Entered By: Carlene Coria on 04/14/2022 09:27:17 -------------------------------------------------------------------------------- Lower Extremity Assessment Details Patient Name: Date of Service: Destiny Nyra Market 04/14/2022 8:45 A M Medical Record Number: [id number] Patient Account Number: [id number] Date of Birth/Sex: Treating RN: [birth year] (67 y.o. Destiny Holland Primary Care Jaramie Bastos: Harrel Lemon Other Clinician: Referring Tinamarie Przybylski: Treating Adriane Guglielmo/Extender: Kalman Shan Self, Referral Weeks in Treatment: 0 Edema Assessment Assessed: [Left: No] [Right: No] Edema: [Left: Ye] [Right: s] Calf Left: Right: Point of Measurement: 32 cm From Medial Instep 38 cm Ankle Left: Right: Point of Measurement: 10 cm From Medial Instep 25 cm Knee To Floor Left: Right: From Medial Instep 46 cm Vascular Assessment Pulses: Dorsalis Pedis Palpable: [Left:Yes] Doppler Audible: [Left:Yes] Blood Pressure: Brachial: [Left:174] Ankle: [Left:Dorsalis Pedis: 130 0.75] Electronic Signature(s) Signed: 04/14/2022 4:00:01 PM By: Carlene Coria RN Entered By: Carlene Coria on 04/14/2022 09:16:09 Destiny Holland, Destiny Holland ([id number]) [id number] Page 5 of 7 -------------------------------------------------------------------------------- Multi Wound Chart Details Patient Name: Date of Service: Destiny Nyra Market 04/14/2022 8:45 A M Medical  Record Number: [id number] Patient Account Number: [id number] Date of Birth/Sex: Treating RN: [birth year] (67 y.o. Destiny Holland Primary Care Trask Vosler: Harrel Lemon Other Clinician: Referring Daquavion Catala: Treating Danne Vasek/Extender: Kalman Shan Self, Referral Weeks in Treatment: 0 Vital Signs Height(in): 67 Pulse(bpm): 85 Weight(lbs): 185 Blood Pressure(mmHg): 174/81 Body Mass Index(BMI): 29 Temperature(F): 97.5 Respiratory Rate(breaths/min): 18 [Treatment Notes:Wound Assessments Treatment Notes] Electronic Signature(s) Signed: 04/14/2022 11:14:54 AM By: Kalman Shan DO Entered By: Kalman Shan on 04/14/2022 09:26:40 -------------------------------------------------------------------------------- Multi-Disciplinary Care Plan Details Patient Name: Date of Service: [facility], Destiny Pickett. 04/14/2022 8:45 A M Medical Record Number: [id number] Patient Account Number: [id number]  Date of Birth/Sex: Treating RN: 02-10-1956 (67 y.o. Destiny Holland Primary Care Canna Nickelson: Harrel Lemon Other Clinician: Referring Braley Luckenbaugh: Treating Oluwakemi Salsberry/Extender: Kalman Shan Self, Referral Weeks in Treatment: 0 Active Inactive Electronic Signature(s) Signed: 04/14/2022 4:00:01 PM By: Carlene Coria RN Entered By: Carlene Coria on 04/14/2022 09:25:36 Destiny Holland, Destiny Holland ([id number]) [id number] Page 6 of 7 -------------------------------------------------------------------------------- Pain Assessment Details Patient Name: Date of Service: Destiny Nyra Market 04/14/2022 8:45 A M Medical Record Number: [id number] Patient Account Number: [id number] Date of Birth/Sex: Treating RN: 1956/02/17 (67 y.o. Destiny Holland Primary Care Edelmira Gallogly: Harrel Lemon Other Clinician: Referring Viki Carrera: Treating Jaime Dome/Extender: Kalman Shan Self, Referral Weeks in Treatment: 0 Active Problems Location of Pain Severity and Description of Pain Patient Has Paino Yes Site  Locations With Dressing Change: Yes Duration of the Pain. Constant / Intermittento Intermittent How Long Does it Lasto Hours: Minutes: 15 Rate the pain. Current Pain Level: 2 Worst Pain Level: 2 Least Pain Level: 0 Tolerable Pain Level: 5 Character of Pain Describe the Pain: Tender Pain Management and Medication Current Pain Management: Medication: Yes Cold Application: No Rest: Yes Massage: No Activity: No T.E.N.S.: No Heat Application: No Leg drop or elevation: No Is the Current Pain Management Adequate: Inadequate How does your wound impact your activities of daily livingo Sleep: No Bathing: No Appetite: No Relationship With Others: No Bladder Continence: No Emotions: No Bowel Continence: No Work: No Toileting: No Drive: No Dressing: No Hobbies: No Electronic Signature(s) Signed: 04/14/2022 4:00:01 PM By: Carlene Coria RN Entered By: Carlene Coria on 04/14/2022 08:59:43 Destiny Holland, Destiny Holland ([id number]) [id number] Page 7 of 7 -------------------------------------------------------------------------------- Patient/Caregiver Education Details Patient Name: Date of Service: Destiny Nyra Market 1/31/2024andnbsp8:45 A M Medical Record Number: [id number] Patient Account Number: [id number] Date of Birth/Gender: Treating RN: 1955-05-21 (67 y.o. Destiny Holland Primary Care Physician: Harrel Lemon Other Clinician: Referring Physician: Treating Physician/Extender: Kalman Shan Self, Referral Weeks in Treatment: 0 Education Assessment Education Provided To: Patient Education Topics Provided Wound/Skin Impairment: Methods: Explain/Verbal Responses: State content correctly Electronic Signature(s) Signed: 04/14/2022 4:00:01 PM By: Carlene Coria RN Entered By: Carlene Coria on 04/14/2022 09:24:59 -------------------------------------------------------------------------------- Destiny Holland Details Patient Name: Date of Service: Destiny Holland, Destiny Pickett.  04/14/2022 8:45 A M Medical Record Number: [id number] Patient Account Number: [id number] Date of Birth/Sex: Treating RN: 1955-06-08 (67 y.o. Destiny Holland Primary Care Keah Lamba: Harrel Lemon Other Clinician: Referring Javionna Leder: Treating Huey Scalia/Extender: Kalman Shan Self, Referral Weeks in Treatment: 0 Vital Signs Time Taken: 08:59 Temperature (F): 97.5 Height (in): 67 Pulse (bpm): 85 Source: Stated Respiratory Rate (breaths/min): 18 Weight (lbs): 185 Blood Pressure (mmHg): 174/81 Source: Stated Reference Range: 80 - 120 mg / dl Body Mass Index (BMI): 29 Electronic Signature(s) Signed: 04/14/2022 4:00:01 PM By: Carlene Coria RN Entered By: Carlene Coria on 04/14/2022 09:01:39

## 2022-04-14 NOTE — Progress Notes (Signed)
Guillet, Aymar L ([id number]) [phone] [id number] Page 1 of 5 Visit Report for 04/14/2022 Abuse Risk Screen Details Patient Name: Date of Service: SHO Destiny Holland 04/14/2022 8:45 A M Medical Record Number: [id number] Patient Account Number: [id number] Date of Birth/Sex: Treating RN: March 26, 1955 (67 y.o. Orvan Falconer Primary Care Kelin Nixon: Harrel Lemon Other Clinician: Referring Jazalynn Mireles: Treating Nara Paternoster/Extender: Kalman Shan Self, Referral Weeks in Treatment: 0 Abuse Risk Screen Items Answer ABUSE RISK SCREEN: Has anyone close to you tried to hurt or harm you recentlyo No Do you feel uncomfortable with anyone in your familyo No Has anyone forced you do things that you didnt want to doo No Electronic Signature(s) Signed: 04/14/2022 4:00:01 PM By: Carlene Coria RN Entered By: Carlene Coria on 04/14/2022 09:04:43 -------------------------------------------------------------------------------- Activities of Daily Living Details Patient Name: Date of Service: SHO Destiny Holland 04/14/2022 8:45 A M Medical Record Number: [id number] Patient Account Number: [id number] Date of Birth/Sex: Treating RN: July 03, 1955 (67 y.o. Orvan Falconer Primary Care Shalona Harbour: Harrel Lemon Other Clinician: Referring Allana Shrestha: Treating Michaelle Bottomley/Extender: Kalman Shan Self, Referral Weeks in Treatment: 0 Activities of Daily Living Items Answer Activities of Daily Living (Please select one for each item) Drive Automobile Completely Able T Medications ake Completely Able Use T elephone Completely Able Care for Appearance Completely Able Use T oilet Completely Able Bath / Shower Completely Able Dress Self Completely Able Feed Self Completely Able Walk Completely Able Get In / Out Bed Completely Able Housework Completely Able Brotzman, Karmen L ([id number]) [id number] Page 2 of 5 Prepare Meals Completely Able Handle Money  Completely Able Shop for Self Completely Able Electronic Signature(s) Signed: 04/14/2022 4:00:01 PM By: Carlene Coria RN Entered By: Carlene Coria on 04/14/2022 09:05:11 -------------------------------------------------------------------------------- Education Screening Details Patient Name: Date of Service: [facility] 04/14/2022 8:45 A M Medical Record Number: [id number] Patient Account Number: [id number] Date of Birth/Sex: Treating RN: December 27, 1955 (67 y.o. Orvan Falconer Primary Care Anarie Kalish: Harrel Lemon Other Clinician: Referring Loni Delbridge: Treating Kelley Polinsky/Extender: Kalman Shan Self, Referral Weeks in Treatment: 0 Primary Learner Assessed: Patient Learning Preferences/Education Level/Primary Language Learning Preference: Explanation Highest Education Level: College or Above Preferred Language: English Cognitive Barrier Language Barrier: No Translator Needed: No Memory Deficit: No Emotional Barrier: No Cultural/Religious Beliefs Affecting Medical Care: No Physical Barrier Impaired Vision: Yes Glasses Impaired Hearing: No Decreased Hand dexterity: No Knowledge/Comprehension Knowledge Level: Medium Comprehension Level: Medium Ability to understand written instructions: High Ability to understand verbal instructions: High Motivation Anxiety Level: Anxious Cooperation: Cooperative Education Importance: Acknowledges Need Interest in Health Problems: Asks Questions Perception: Coherent Willingness to Engage in Self-Management High Activities: Readiness to Engage in Self-Management High Activities: Electronic Signature(s) Signed: 04/14/2022 4:00:01 PM By: Carlene Coria RN Entered By: Carlene Coria on 04/14/2022 09:05:37 Marten, Mauri Reading ([id number]) [id number] Page 3 of 5 -------------------------------------------------------------------------------- Fall Risk Assessment Details Patient Name: Date of Service: SHO Destiny Holland 04/14/2022 8:45 A M Medical Record Number: [id number] Patient Account Number: [id number] Date of Birth/Sex: Treating RN: [birth year] (67 y.o. Orvan Falconer Primary Care Osa Fogarty: Harrel Lemon Other Clinician: Referring Makail Watling: Treating Margorie Renner/Extender: Kalman Shan Self, Referral Weeks in Treatment: 0 Fall Risk Assessment Items Have you had 2 or more falls in the last 12 monthso 0 No Have you had any fall that resulted in injury in the last 12 monthso 0 No FALLS RISK SCREEN History of falling - immediate or within 3 months 0 No Secondary diagnosis (Do you have 2 or more medical diagnoseso) 0  No Ambulatory aid None/bed rest/wheelchair/nurse 0 Yes Crutches/cane/walker 0 No Furniture 0 No Intravenous therapy Access/Saline/Heparin Lock 0 No Gait/Transferring Normal/ bed rest/ wheelchair 0 Yes Weak (short steps with or without shuffle, stooped but able to lift head while walking, may seek 0 No support from furniture) Impaired (short steps with shuffle, may have difficulty arising from chair, head down, impaired 0 No balance) Mental Status Oriented to own ability 0 Yes Electronic Signature(s) Signed: 04/14/2022 4:00:01 PM By: Carlene Coria RN Entered By: Carlene Coria on 04/14/2022 09:05:53 -------------------------------------------------------------------------------- Foot Assessment Details Patient Name: Date of Service: [facility] 04/14/2022 8:45 A M Medical Record Number: [id number] Patient Account Number: [id number] Date of Birth/Sex: Treating RN: [birth year] (67 y.o. Orvan Falconer Primary Care Elonzo Sopp: Harrel Lemon Other Clinician: Referring Donalda Job: Treating Iridessa Harrow/Extender: Kalman Shan Self, Referral Weeks in Treatment: 0 Foot Assessment Items Site Locations Niland, Watson L ([id number]) [phone] [id number] Page 4 of 5 + = Sensation present, - = Sensation absent, C = Callus, U = Ulcer R = Redness, W =  Warmth, M = Maceration, PU = Pre-ulcerative lesion F = Fissure, S = Swelling, D = Dryness Assessment Right: Left: Other Deformity: No Yes Prior Foot Ulcer: No No Prior Amputation: No No Charcot Joint: No No Ambulatory Status: Ambulatory Without Help Gait: Steady Electronic Signature(s) Signed: 04/14/2022 4:00:01 PM By: Carlene Coria RN Entered By: Carlene Coria on 04/14/2022 09:09:20 -------------------------------------------------------------------------------- Nutrition Risk Screening Details Patient Name: Date of Service: SHO Destiny Holland 04/14/2022 8:45 A M Medical Record Number: [id number] Patient Account Number: [id number] Date of Birth/Sex: Treating RN: [birth year] (67 y.o. Orvan Falconer Primary Care Sammie Denner: Harrel Lemon Other Clinician: Referring Frank Pilger: Treating Shaliyah Taite/Extender: Kalman Shan Self, Referral Weeks in Treatment: 0 Height (in): 67 Weight (lbs): 185 Body Mass Index (BMI): 29 Nutrition Risk Screening Items Score Screening NUTRITION RISK SCREEN: I have an illness or condition that made me change the kind and/or amount of food I eat 0 No I eat fewer than two meals per day 0 No I eat few fruits and vegetables, or milk products 0 No I have three or more drinks of beer, liquor or wine almost every day 0 No I have tooth or mouth problems that make it hard for me to eat 0 No I don't always have enough money to buy the food I need 0 No Housley, Khailee L ([id number]) [id number] Page 5 of 5 I eat alone most of the time 0 No I take three or more different prescribed or over-the-counter drugs a day 1 Yes Without wanting to, I have lost or gained 10 pounds in the last six months 0 No I am not always physically able to shop, cook and/or feed myself 0 No Nutrition Protocols Good Risk Protocol 0 No interventions needed Moderate Risk Protocol High Risk Proctocol Risk Level: Good Risk Score: 1 Electronic  Signature(s) Signed: 04/14/2022 4:00:01 PM By: Carlene Coria RN Entered By: Carlene Coria on 04/14/2022 09:06:02

## 2022-04-15 DIAGNOSIS — M8589 Other specified disorders of bone density and structure, multiple sites: Secondary | ICD-10-CM | POA: Diagnosis not present

## 2022-04-15 DIAGNOSIS — I10 Essential (primary) hypertension: Secondary | ICD-10-CM | POA: Diagnosis not present

## 2022-04-22 DIAGNOSIS — I10 Essential (primary) hypertension: Secondary | ICD-10-CM | POA: Diagnosis not present

## 2022-04-22 DIAGNOSIS — Z0001 Encounter for general adult medical examination with abnormal findings: Secondary | ICD-10-CM | POA: Diagnosis not present

## 2022-04-22 DIAGNOSIS — L97921 Non-pressure chronic ulcer of unspecified part of left lower leg limited to breakdown of skin: Secondary | ICD-10-CM | POA: Diagnosis not present

## 2022-04-22 DIAGNOSIS — G629 Polyneuropathy, unspecified: Secondary | ICD-10-CM | POA: Diagnosis not present

## 2022-04-22 DIAGNOSIS — Z0000 Encounter for general adult medical examination without abnormal findings: Secondary | ICD-10-CM | POA: Diagnosis not present

## 2022-04-22 DIAGNOSIS — Z1211 Encounter for screening for malignant neoplasm of colon: Secondary | ICD-10-CM | POA: Diagnosis not present

## 2022-04-26 ENCOUNTER — Ambulatory Visit
Admission: RE | Admit: 2022-04-26 | Discharge: 2022-04-26 | Disposition: A | Discharge status: Home / Self Care | Payer: Medicare HMO | Source: Ambulatory Visit | Attending: Internal Medicine | Admitting: Internal Medicine

## 2022-04-26 DIAGNOSIS — R921 Mammographic calcification found on diagnostic imaging of breast: Secondary | ICD-10-CM | POA: Diagnosis not present

## 2022-04-26 DIAGNOSIS — R928 Other abnormal and inconclusive findings on diagnostic imaging of breast: Secondary | ICD-10-CM | POA: Diagnosis not present

## 2022-05-13 DIAGNOSIS — H1045 Other chronic allergic conjunctivitis: Secondary | ICD-10-CM | POA: Diagnosis not present

## 2022-05-13 DIAGNOSIS — H2513 Age-related nuclear cataract, bilateral: Secondary | ICD-10-CM | POA: Diagnosis not present

## 2022-05-13 DIAGNOSIS — T1502XA Foreign body in cornea, left eye, initial encounter: Secondary | ICD-10-CM | POA: Diagnosis not present

## 2022-05-18 DIAGNOSIS — H2513 Age-related nuclear cataract, bilateral: Secondary | ICD-10-CM | POA: Diagnosis not present

## 2022-05-18 DIAGNOSIS — Z0100 Encounter for examination of eyes and vision without abnormal findings: Secondary | ICD-10-CM | POA: Diagnosis not present

## 2022-05-18 DIAGNOSIS — T1502XA Foreign body in cornea, left eye, initial encounter: Secondary | ICD-10-CM | POA: Diagnosis not present

## 2022-05-28 DIAGNOSIS — I10 Essential (primary) hypertension: Secondary | ICD-10-CM | POA: Diagnosis not present

## 2022-05-28 DIAGNOSIS — G629 Polyneuropathy, unspecified: Secondary | ICD-10-CM | POA: Diagnosis not present

## 2022-05-28 DIAGNOSIS — L03032 Cellulitis of left toe: Secondary | ICD-10-CM | POA: Diagnosis not present

## 2022-06-03 DIAGNOSIS — L929 Granulomatous disorder of the skin and subcutaneous tissue, unspecified: Secondary | ICD-10-CM | POA: Diagnosis not present

## 2022-06-04 DIAGNOSIS — L929 Granulomatous disorder of the skin and subcutaneous tissue, unspecified: Secondary | ICD-10-CM | POA: Diagnosis not present

## 2022-06-10 DIAGNOSIS — L03032 Cellulitis of left toe: Secondary | ICD-10-CM | POA: Diagnosis not present

## 2022-07-08 DIAGNOSIS — Z08 Encounter for follow-up examination after completed treatment for malignant neoplasm: Secondary | ICD-10-CM | POA: Diagnosis not present

## 2022-07-08 DIAGNOSIS — Z85828 Personal history of other malignant neoplasm of skin: Secondary | ICD-10-CM | POA: Diagnosis not present

## 2022-09-06 ENCOUNTER — Other Ambulatory Visit: Payer: Self-pay | Admitting: Internal Medicine

## 2022-09-06 ENCOUNTER — Encounter: Payer: Self-pay | Admitting: Internal Medicine

## 2022-09-06 DIAGNOSIS — Z1231 Encounter for screening mammogram for malignant neoplasm of breast: Secondary | ICD-10-CM

## 2022-09-10 ENCOUNTER — Other Ambulatory Visit: Payer: Self-pay | Admitting: Internal Medicine

## 2022-09-10 DIAGNOSIS — R921 Mammographic calcification found on diagnostic imaging of breast: Secondary | ICD-10-CM

## 2022-09-20 IMAGING — MG MM DIGITAL DIAGNOSTIC UNILAT*L* W/ TOMO W/ CAD
4 series · 4 of 8 positions shown · non-contrast
Comparison: Previous exam(s).

CLINICAL DATA: Recall from screening mammography, calcifications
involving the UPPER OUTER QUADRANT the LEFT breast at posterior
depth.

Family history of breast cancer in her sister who recently completed
radiation therapy.
EXAM:
DIGITAL DIAGNOSTIC UNILATERAL LEFT MAMMOGRAM WITH TOMOSYNTHESIS AND
CAD
TECHNIQUE: Left digital diagnostic mammography and breast tomosynthesis was
performed. The images were evaluated with computer-aided detection.

[L CC]
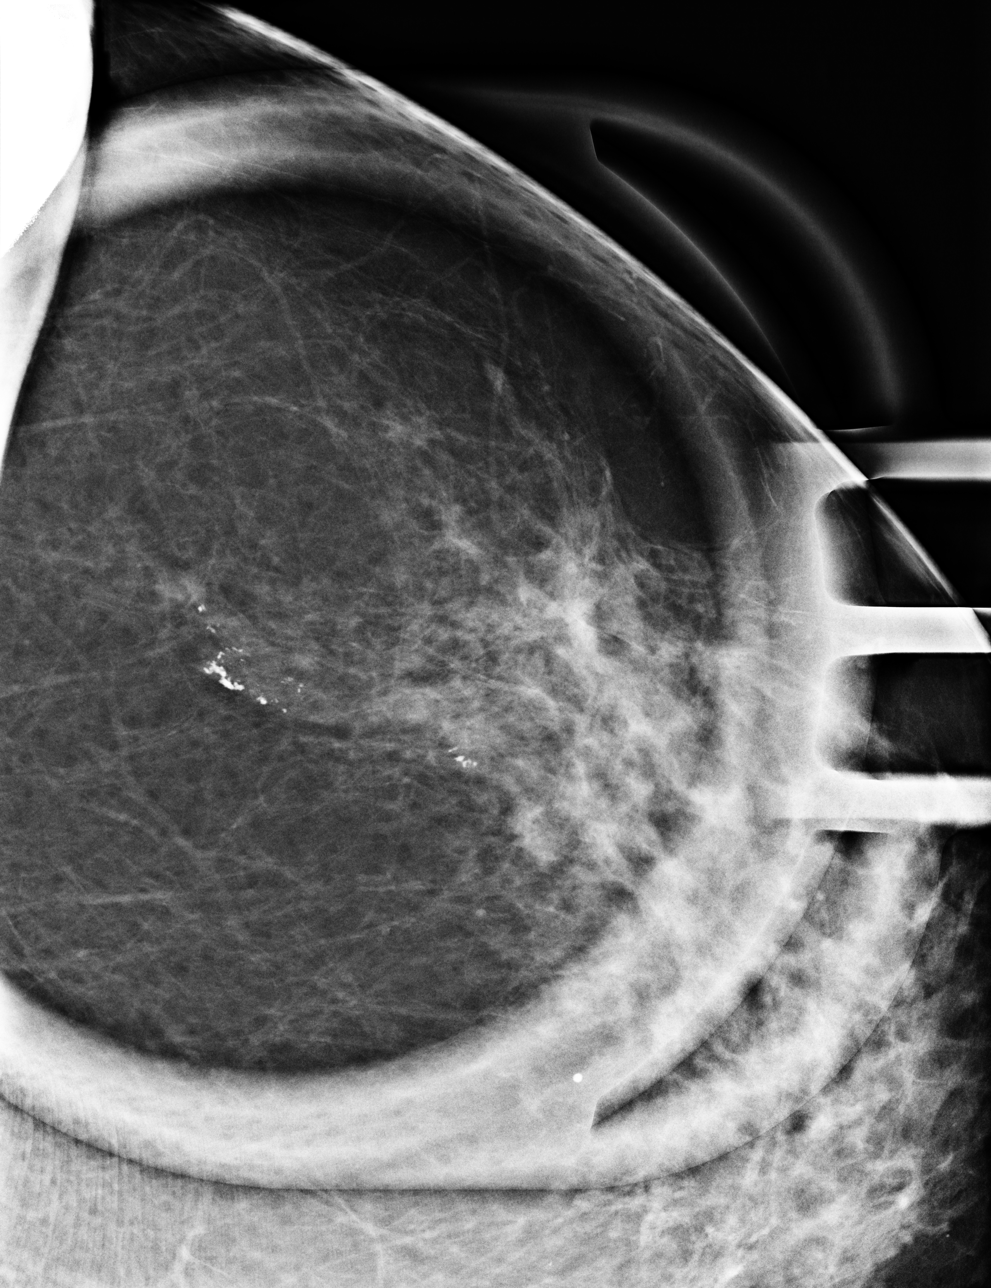

[L LM]
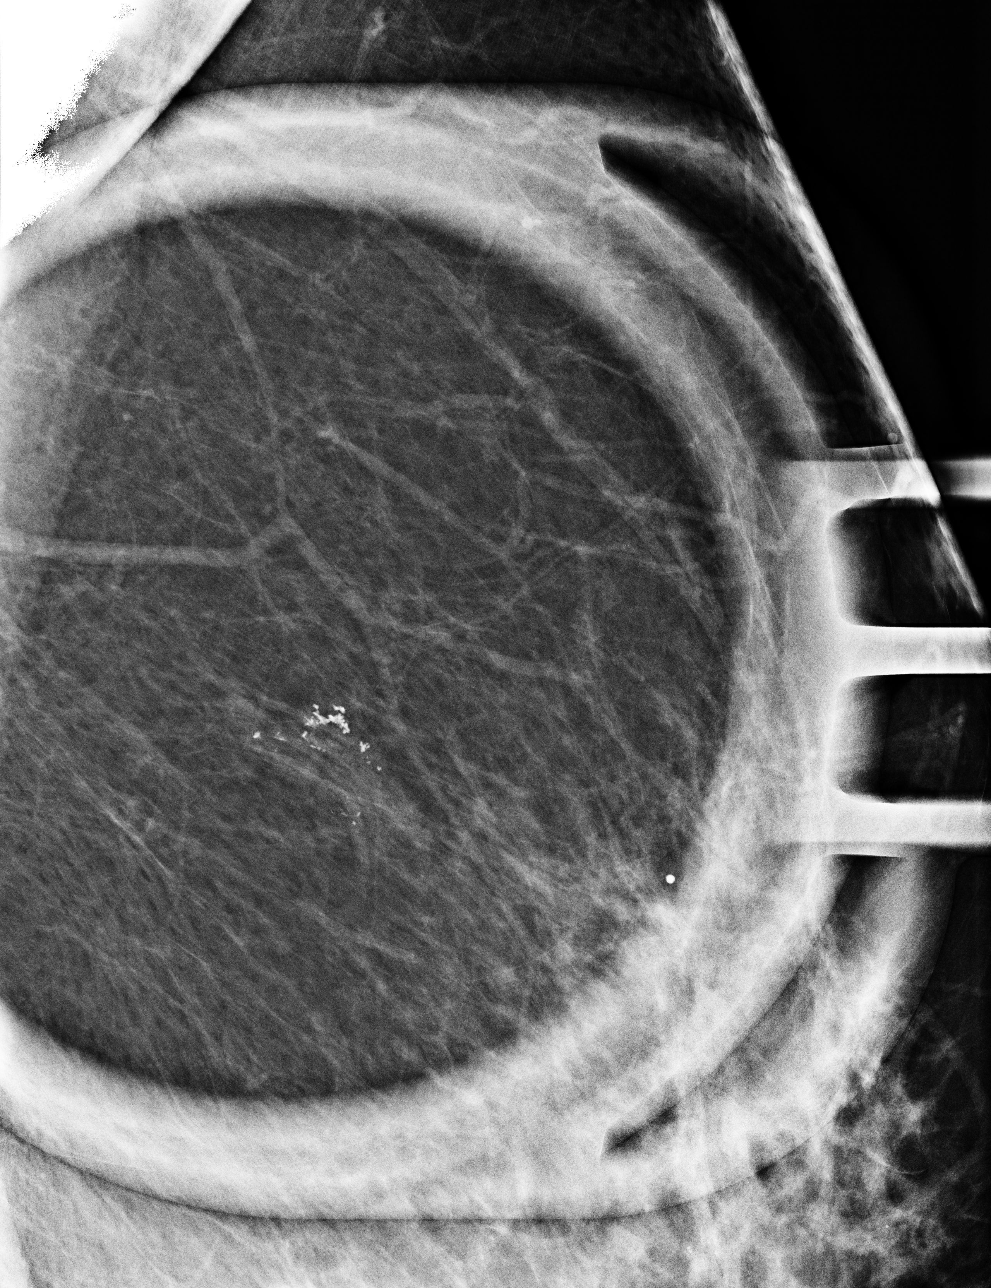

[L ML synth-2D]
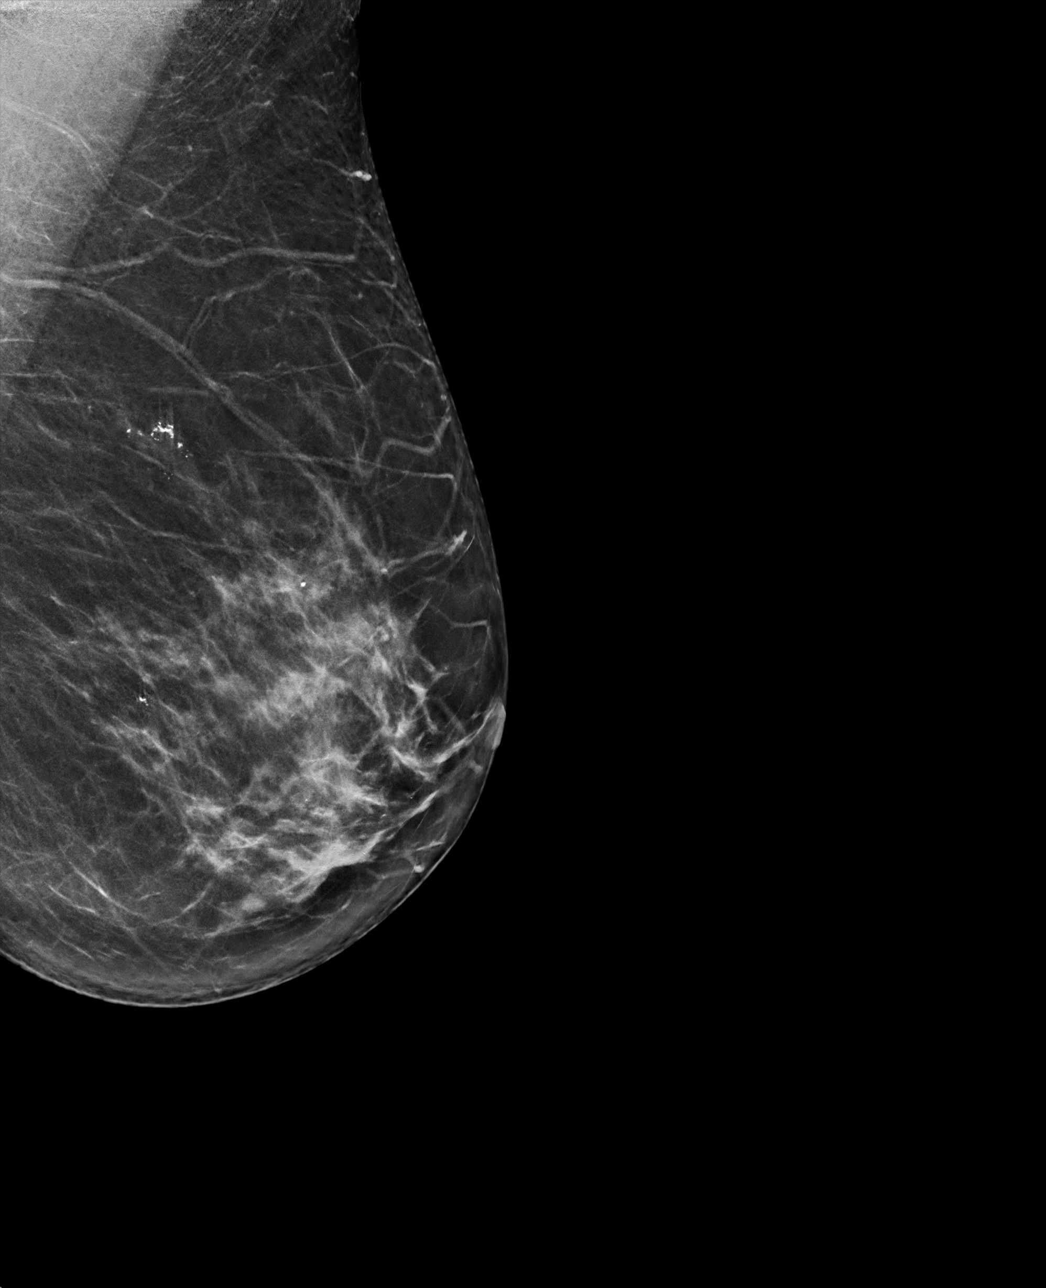

[L ML tomo · tomo slice 39/76.0]
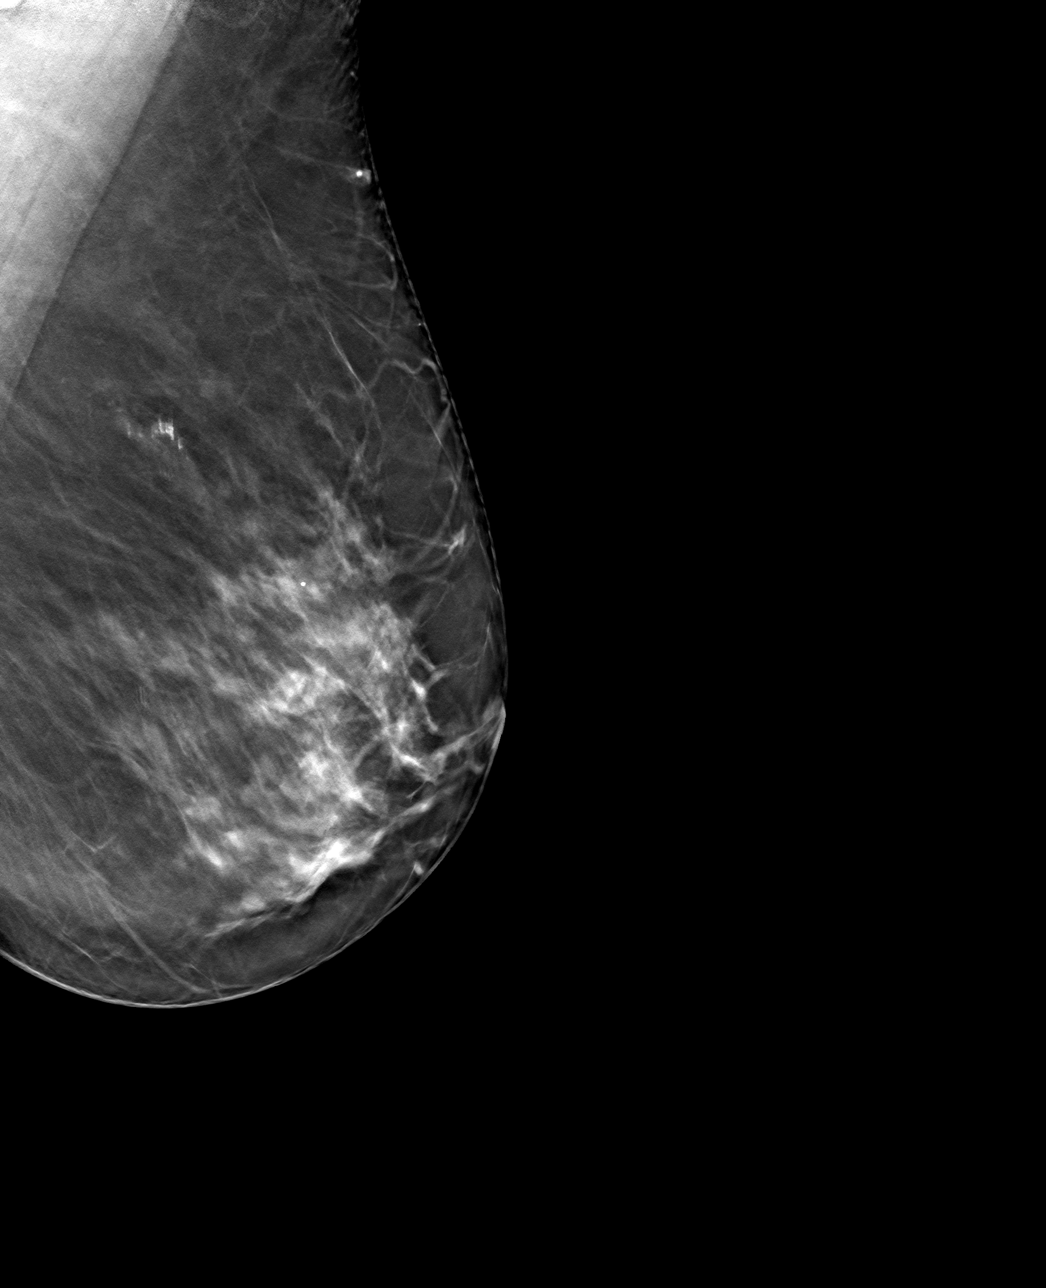

[4 of 8 positions shown; findings below may reference images not displayed]

ACR Breast Density Category c: The breast tissue is heterogeneously
dense, which may obscure small masses.
FINDINGS: Spot magnification CC and mediolateral views of the calcifications
and a full field mediolateral view were obtained.

There is a group of coarse heterogeneous calcifications in the UPPER
OUTER QUADRANT at posterior depth which span approximately 1.4 cm.
The calcifications are in a somewhat linear orientation directed
toward the nipple.

On the spot magnification CC view, there is a second group of
coarse, likely fibroadenomatoid calcifications which span 0.3 cm;
these localize to the lower breast, therefore LOWER OUTER QUADRANT
on the full field mediolateral view.
IMPRESSION: 1. Indeterminate 1.4 cm group of calcifications involving the UPPER
OUTER QUADRANT of the LEFT breast at posterior depth.
2. Likely benign 0.3 cm group of fibroadenomatoid type
calcifications involving the LOWER OUTER QUADRANT.

RECOMMENDATION:
1. Stereotactic tomosynthesis core needle biopsy of the
indeterminate calcifications in the UPPER OUTER QUADRANT of the LEFT
breast.
2. If the biopsy demonstrates benign findings, then six-month
follow-up of the 0.3 cm group of likely benign calcifications in the
LOWER OUTER QUADRANT is recommended.
3. If the biopsy demonstrates atypia or malignancy, then
stereotactic tomosynthesis core needle biopsy of the 0.3 cm group in
the LOWER OUTER QUADRANT is recommended.

I have discussed the findings and recommendations with the patient.
The biopsy will be scheduled by the Breast Imaging staff at [HOSPITAL]
[REDACTED].

BI-RADS CATEGORY  4: Suspicious.

## 2022-10-27 ENCOUNTER — Inpatient Hospital Stay: Admission: RE | Admit: 2022-10-27 | Payer: Medicare HMO | Source: Ambulatory Visit

## 2022-10-27 ENCOUNTER — Ambulatory Visit
Admission: RE | Admit: 2022-10-27 | Discharge: 2022-10-27 | Disposition: A | Discharge status: Home / Self Care | Payer: Medicare HMO | Source: Ambulatory Visit | Attending: Internal Medicine | Admitting: Internal Medicine

## 2022-10-27 DIAGNOSIS — R921 Mammographic calcification found on diagnostic imaging of breast: Secondary | ICD-10-CM | POA: Diagnosis not present

## 2022-10-27 DIAGNOSIS — R928 Other abnormal and inconclusive findings on diagnostic imaging of breast: Secondary | ICD-10-CM | POA: Diagnosis not present

## 2022-10-27 DIAGNOSIS — R92333 Mammographic heterogeneous density, bilateral breasts: Secondary | ICD-10-CM | POA: Diagnosis not present

## 2023-01-18 DIAGNOSIS — Z85828 Personal history of other malignant neoplasm of skin: Secondary | ICD-10-CM | POA: Diagnosis not present

## 2023-01-18 DIAGNOSIS — I872 Venous insufficiency (chronic) (peripheral): Secondary | ICD-10-CM | POA: Diagnosis not present

## 2023-01-18 DIAGNOSIS — L814 Other melanin hyperpigmentation: Secondary | ICD-10-CM | POA: Diagnosis not present

## 2023-01-18 DIAGNOSIS — L249 Irritant contact dermatitis, unspecified cause: Secondary | ICD-10-CM | POA: Diagnosis not present

## 2023-01-18 DIAGNOSIS — L603 Nail dystrophy: Secondary | ICD-10-CM | POA: Diagnosis not present

## 2023-01-18 DIAGNOSIS — D229 Melanocytic nevi, unspecified: Secondary | ICD-10-CM | POA: Diagnosis not present

## 2023-01-18 DIAGNOSIS — L308 Other specified dermatitis: Secondary | ICD-10-CM | POA: Diagnosis not present

## 2023-01-18 DIAGNOSIS — L989 Disorder of the skin and subcutaneous tissue, unspecified: Secondary | ICD-10-CM | POA: Diagnosis not present

## 2023-02-18 DIAGNOSIS — M67962 Unspecified disorder of synovium and tendon, left lower leg: Secondary | ICD-10-CM | POA: Diagnosis not present

## 2023-02-18 DIAGNOSIS — M19072 Primary osteoarthritis, left ankle and foot: Secondary | ICD-10-CM | POA: Diagnosis not present

## 2023-03-04 DIAGNOSIS — L0109 Other impetigo: Secondary | ICD-10-CM | POA: Diagnosis not present

## 2023-03-04 DIAGNOSIS — L989 Disorder of the skin and subcutaneous tissue, unspecified: Secondary | ICD-10-CM | POA: Diagnosis not present

## 2023-04-18 DIAGNOSIS — L989 Disorder of the skin and subcutaneous tissue, unspecified: Secondary | ICD-10-CM | POA: Diagnosis not present

## 2023-04-20 DIAGNOSIS — I10 Essential (primary) hypertension: Secondary | ICD-10-CM | POA: Diagnosis not present

## 2023-04-27 DIAGNOSIS — Z0001 Encounter for general adult medical examination with abnormal findings: Secondary | ICD-10-CM | POA: Diagnosis not present

## 2023-04-27 DIAGNOSIS — Z1331 Encounter for screening for depression: Secondary | ICD-10-CM | POA: Diagnosis not present

## 2023-04-27 DIAGNOSIS — G629 Polyneuropathy, unspecified: Secondary | ICD-10-CM | POA: Diagnosis not present

## 2023-04-27 DIAGNOSIS — Z0000 Encounter for general adult medical examination without abnormal findings: Secondary | ICD-10-CM | POA: Diagnosis not present

## 2023-04-27 DIAGNOSIS — I10 Essential (primary) hypertension: Secondary | ICD-10-CM | POA: Diagnosis not present

## 2023-05-03 DIAGNOSIS — L989 Disorder of the skin and subcutaneous tissue, unspecified: Secondary | ICD-10-CM | POA: Diagnosis not present

## 2023-05-11 DIAGNOSIS — M810 Age-related osteoporosis without current pathological fracture: Secondary | ICD-10-CM | POA: Diagnosis not present

## 2023-06-02 DIAGNOSIS — H16223 Keratoconjunctivitis sicca, not specified as Sjogren's, bilateral: Secondary | ICD-10-CM | POA: Diagnosis not present

## 2023-06-02 DIAGNOSIS — H2513 Age-related nuclear cataract, bilateral: Secondary | ICD-10-CM | POA: Diagnosis not present

## 2023-06-02 DIAGNOSIS — H524 Presbyopia: Secondary | ICD-10-CM | POA: Diagnosis not present

## 2023-06-02 DIAGNOSIS — Z0100 Encounter for examination of eyes and vision without abnormal findings: Secondary | ICD-10-CM | POA: Diagnosis not present

## 2023-06-02 DIAGNOSIS — D3132 Benign neoplasm of left choroid: Secondary | ICD-10-CM | POA: Diagnosis not present

## 2023-07-14 DIAGNOSIS — L989 Disorder of the skin and subcutaneous tissue, unspecified: Secondary | ICD-10-CM | POA: Diagnosis not present

## 2023-07-14 DIAGNOSIS — L84 Corns and callosities: Secondary | ICD-10-CM | POA: Diagnosis not present

## 2023-07-28 DIAGNOSIS — R238 Other skin changes: Secondary | ICD-10-CM | POA: Diagnosis not present

## 2023-09-22 DIAGNOSIS — L97921 Non-pressure chronic ulcer of unspecified part of left lower leg limited to breakdown of skin: Secondary | ICD-10-CM | POA: Diagnosis not present

## 2023-12-08 ENCOUNTER — Other Ambulatory Visit: Payer: Self-pay | Admitting: Internal Medicine

## 2023-12-08 DIAGNOSIS — R921 Mammographic calcification found on diagnostic imaging of breast: Secondary | ICD-10-CM

## 2023-12-23 ENCOUNTER — Ambulatory Visit
Admission: RE | Admit: 2023-12-23 | Discharge: 2023-12-23 | Disposition: A | Discharge status: Home / Self Care | Source: Ambulatory Visit | Attending: Internal Medicine | Admitting: Internal Medicine

## 2023-12-23 ENCOUNTER — Other Ambulatory Visit

## 2023-12-23 ENCOUNTER — Other Ambulatory Visit: Payer: Self-pay | Admitting: Internal Medicine

## 2023-12-23 DIAGNOSIS — R921 Mammographic calcification found on diagnostic imaging of breast: Secondary | ICD-10-CM | POA: Diagnosis not present

## 2023-12-23 DIAGNOSIS — N6315 Unspecified lump in the right breast, overlapping quadrants: Secondary | ICD-10-CM | POA: Diagnosis not present

## 2024-01-24 DIAGNOSIS — L989 Disorder of the skin and subcutaneous tissue, unspecified: Secondary | ICD-10-CM | POA: Diagnosis not present

## 2024-01-24 DIAGNOSIS — L249 Irritant contact dermatitis, unspecified cause: Secondary | ICD-10-CM | POA: Diagnosis not present

## 2024-01-24 DIAGNOSIS — D485 Neoplasm of uncertain behavior of skin: Secondary | ICD-10-CM | POA: Diagnosis not present

## 2024-01-24 DIAGNOSIS — L821 Other seborrheic keratosis: Secondary | ICD-10-CM | POA: Diagnosis not present

## 2024-01-24 DIAGNOSIS — D229 Melanocytic nevi, unspecified: Secondary | ICD-10-CM | POA: Diagnosis not present

## 2024-01-24 DIAGNOSIS — L814 Other melanin hyperpigmentation: Secondary | ICD-10-CM | POA: Diagnosis not present

## 2024-01-24 DIAGNOSIS — Z85828 Personal history of other malignant neoplasm of skin: Secondary | ICD-10-CM | POA: Diagnosis not present

## 2024-01-25 DIAGNOSIS — D485 Neoplasm of uncertain behavior of skin: Secondary | ICD-10-CM | POA: Diagnosis not present

## 2024-02-20 DIAGNOSIS — L2389 Allergic contact dermatitis due to other agents: Secondary | ICD-10-CM | POA: Diagnosis not present

## 2024-02-20 DIAGNOSIS — C44519 Basal cell carcinoma of skin of other part of trunk: Secondary | ICD-10-CM | POA: Diagnosis not present
